# Patient Record
Sex: Female | Born: 1995 | Race: White | Hispanic: No | Marital: Married | Smoking: Never smoker
Health system: Southern US, Community
[De-identification: ages and names within clinical notes are randomized; demographics above are authoritative.]

## PROBLEM LIST (undated history)

## (undated) DIAGNOSIS — R519 Headache, unspecified: Secondary | ICD-10-CM

## (undated) DIAGNOSIS — R51 Headache: Secondary | ICD-10-CM

## (undated) DIAGNOSIS — Z8759 Personal history of other complications of pregnancy, childbirth and the puerperium: Secondary | ICD-10-CM

## (undated) DIAGNOSIS — K219 Gastro-esophageal reflux disease without esophagitis: Secondary | ICD-10-CM

## (undated) DIAGNOSIS — J45909 Unspecified asthma, uncomplicated: Secondary | ICD-10-CM

## (undated) HISTORY — PX: UPPER GI ENDOSCOPY: SHX6162

## (undated) HISTORY — DX: Headache: R51

## (undated) HISTORY — DX: Gastro-esophageal reflux disease without esophagitis: K21.9

## (undated) HISTORY — DX: Headache, unspecified: R51.9

## (undated) HISTORY — PX: WISDOM TOOTH EXTRACTION: SHX21

---
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
## 2014-03-02 ENCOUNTER — Encounter: Payer: Self-pay | Admitting: Family Medicine

## 2014-03-02 ENCOUNTER — Encounter (INDEPENDENT_AMBULATORY_CARE_PROVIDER_SITE_OTHER): Payer: Self-pay

## 2014-03-02 ENCOUNTER — Ambulatory Visit (INDEPENDENT_AMBULATORY_CARE_PROVIDER_SITE_OTHER): Payer: 59 | Admitting: Family Medicine

## 2014-03-02 VITALS — BP 102/60 | HR 59 | Temp 98.1°F | Ht 66.0 in | Wt 144.8 lb

## 2014-03-02 DIAGNOSIS — S0300XS Dislocation of jaw, unspecified side, sequela: Secondary | ICD-10-CM

## 2014-03-02 DIAGNOSIS — E162 Hypoglycemia, unspecified: Secondary | ICD-10-CM | POA: Insufficient documentation

## 2014-03-02 DIAGNOSIS — S0300XA Dislocation of jaw, unspecified side, initial encounter: Secondary | ICD-10-CM | POA: Insufficient documentation

## 2014-03-02 DIAGNOSIS — R42 Dizziness and giddiness: Secondary | ICD-10-CM

## 2014-03-02 DIAGNOSIS — K219 Gastro-esophageal reflux disease without esophagitis: Secondary | ICD-10-CM

## 2014-03-02 DIAGNOSIS — IMO0002 Reserved for concepts with insufficient information to code with codable children: Secondary | ICD-10-CM

## 2014-03-02 DIAGNOSIS — Z0000 Encounter for general adult medical examination without abnormal findings: Secondary | ICD-10-CM | POA: Insufficient documentation

## 2014-03-02 HISTORY — DX: Dislocation of jaw, unspecified side, initial encounter: S03.00XA

## 2014-03-02 LAB — CBC WITH DIFFERENTIAL/PLATELET
Basophils Absolute: 0 10*3/uL (ref 0.0–0.1)
Basophils Relative: 0.7 % (ref 0.0–3.0)
Eosinophils Absolute: 0.2 10*3/uL (ref 0.0–0.7)
Eosinophils Relative: 4 % (ref 0.0–5.0)
HEMATOCRIT: 35 % — AB (ref 36.0–49.0)
Hemoglobin: 11.6 g/dL — ABNORMAL LOW (ref 12.0–16.0)
LYMPHS ABS: 1.5 10*3/uL (ref 0.7–4.0)
Lymphocytes Relative: 31 % (ref 24.0–48.0)
MCHC: 33.1 g/dL (ref 31.0–37.0)
MCV: 81.4 fl (ref 78.0–98.0)
MONO ABS: 0.5 10*3/uL (ref 0.1–1.0)
MONOS PCT: 11 % (ref 3.0–12.0)
Neutro Abs: 2.5 10*3/uL (ref 1.4–7.7)
Neutrophils Relative %: 53.3 % (ref 43.0–71.0)
PLATELETS: 211 10*3/uL (ref 150.0–575.0)
RBC: 4.29 Mil/uL (ref 3.80–5.70)
RDW: 14.1 % (ref 11.4–15.5)
WBC: 4.7 10*3/uL (ref 4.5–13.5)

## 2014-03-02 LAB — TSH: TSH: 1.88 u[IU]/mL (ref 0.40–5.00)

## 2014-03-02 LAB — HEMOGLOBIN A1C: Hgb A1c MFr Bld: 5.3 % (ref 4.6–6.5)

## 2014-03-02 NOTE — Assessment & Plan Note (Signed)
Check a1c today

## 2014-03-02 NOTE — Assessment & Plan Note (Signed)
Labs today

## 2014-03-02 NOTE — Progress Notes (Signed)
Pre visit review using our clinic review tool, if applicable. No additional management support is needed unless otherwise documented below in the visit note. 

## 2014-03-02 NOTE — Assessment & Plan Note (Signed)
Discussed GERD diet- see a1c. Advised ok to take H2 blocker more regularly than Nexium.

## 2014-03-02 NOTE — Progress Notes (Signed)
Subjective:   Patient ID: Glenda Morrison, female    DOB: [birth year], 18 y.o.   MRN: [id number]  Glenda HaroldRebekah Morrison is a pleasant 18 y.o. year old female who presents to clinic today with Establish Care, Annual Exam and Heartburn  on 03/02/2014  HPI:  Just graduated from high school- plans on going to stay with her brother in [place] for a few months.  Brings in old records- [facility]  Was last seen on 12/01/13- persistent GERD symptoms?RUQ pain. Was also associated with nausea, hurt to touch under rib. Abdominal ultrasound from 06/16/13 and 12/03/13 unremarkable. H.Pylori indeterminate. Pain does seem worse when she has not eaten in a long time or if she eats pizza sauce.   Pain has actually been better lately.  Was taking Nexium which did help. 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  Always takes with food.  She is concerned about low blood sugar.  Last two months, feels very shaky and nauseated if it has been over an hour since she ate.  No family h/o DM.  Lab Results  Component Value Date   ALT 20 06/16/2013   AST 19 06/16/2013   Lab Results  Component Value Date   WBC 6.4 06/16/2013   HGB 12.0 06/16/2013   HCT 35* 06/16/2013   PLT 256 06/16/2013   No current outpatient prescriptions on file prior to visit.   No current facility-administered medications on file prior to visit.    No Known Allergies  Past Medical History  Diagnosis Date  . GERD (gastroesophageal reflux disease)   . Frequent headaches     Past Surgical History  Procedure Laterality Date  . Wisdom tooth extraction      Family History  Problem Relation Age of Onset  . Arthritis Father   . Stroke Father   . Cancer Maternal Uncle   . Stroke Paternal Uncle   . Arthritis Maternal Grandmother   . Cancer Maternal Grandfather   . Alcohol abuse Paternal Grandfather     History   Social History  . Marital Status:  Single    Spouse Name: N/A    Number of Children: N/A  . Years of Education: N/A   Occupational History  . Not on file.   Social History Main Topics  . Smoking status: Never Smoker   . Smokeless tobacco: Never Used  . Alcohol Use: No  . Drug Use: No  . Sexual Activity: No   Other Topics Concern  . 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    Review of Systems See HPI No changes in her bowel habits No blood in stool or black stools No CP or SOB Virginal    Objective:    BP 102/60  Pulse 59  Temp(Src) 98.1 F (36.7 C) (Oral)  Ht 5\' 6"  (1.676 m)  Wt 144 lb 12 oz (65.658 kg)  BMI 23.37 kg/m2  SpO2 99%  LMP 02/20/2014   Physical Exam   General:  Well-developed,well-nourished,in no acute distress; alert,appropriate and cooperative throughout examination Head:  normocephalic and atraumatic.   Eyes:  vision grossly intact, pupils equal, pupils round, and pupils reactive to light.   Ears:  R ear normal and L ear normal.  Nose:  no external deformity.   Mouth:  good dentition.   Neck:  No deformities, masses, or tenderness noted.   Lungs:  Normal respiratory effort, chest expands symmetrically. Lungs are clear to auscultation, no crackles or wheezes. Heart:  Normal rate and regular rhythm. S1 and S2 normal without gallop, murmur, click, rub or other extra sounds. Abdomen:  Bowel sounds positive,abdomen soft and non-tender without masses, organomegaly or hernias noted. Msk:  No deformity or scoliosis noted of thoracic or lumbar spine.   Extremities:  No clubbing, cyanosis, edema, or deformity noted with normal full range of motion of all joints.   Neurologic:  alert & oriented X3 and gait normal.   Skin:  Intact without suspicious lesions or rashes Cervical Nodes:  No  lymphadenopathy noted Axillary Nodes:  No palpable lymphadenopathy Psych:  Cognition and judgment appear intact. Alert and cooperative with normal attention span and concentration. No apparent delusions, illusions, hallucinations       Assessment & Plan:   Routine general medical examination at a health care facility  Gastroesophageal reflux disease, esophagitis presence not specified  Hypoglycemia - Plan: Hemoglobin A1c  Dizziness and giddiness - Plan: TSH, CBC with Differential  TMJ (dislocation of temporomandibular joint), sequela No Follow-up on file.

## 2014-03-02 NOTE — Assessment & Plan Note (Signed)
Reviewed preventive care protocols, scheduled due services, and updated immunizations Discussed nutrition, exercise, diet, and healthy lifestyle.  

## 2014-03-02 NOTE — Patient Instructions (Signed)
   Great to meet you. You can try over the counter Pepcid or Zantac.  Its ok to take Nexium when you have to.  Food Choices for Gastroesophageal Reflux Disease When you have gastroesophageal reflux disease (GERD), the foods you eat and your eating habits are very important. Choosing the right foods can help ease the discomfort of GERD. WHAT GENERAL GUIDELINES DO I NEED TO FOLLOW?  Choose fruits, vegetables, whole grains, low-fat dairy products, and low-fat meat, fish, and poultry.  Limit fats such as oils, salad dressings, butter, nuts, and avocado.  Keep a food diary to identify foods that cause symptoms.  Avoid foods that cause reflux. These may be different for different people.  Eat frequent small meals instead of three large meals each day.  Eat your meals slowly, in a relaxed setting.  Limit fried foods.  Cook foods using methods other than frying.  Avoid drinking alcohol.  Avoid drinking large amounts of liquids with your meals.  Avoid bending over or lying down until 2-3 hours after eating. WHAT FOODS ARE NOT RECOMMENDED? The following are some foods and drinks that may worsen your symptoms: Vegetables Tomatoes. Tomato juice. Tomato and spaghetti sauce. Chili peppers. Onion and garlic. Horseradish. Fruits Oranges, grapefruit, and lemon (fruit and juice). Meats High-fat meats, fish, and poultry. This includes hot dogs, ribs, ham, sausage, salami, and bacon. Dairy Whole milk and chocolate milk. Sour cream. Cream. Butter. Ice cream. Cream cheese.  Beverages Coffee and tea, with or without caffeine. Carbonated beverages or energy drinks. Condiments Hot sauce. Barbecue sauce.  Sweets/Desserts Chocolate and cocoa. Donuts. Peppermint and spearmint. Fats and Oils High-fat foods, including [place] fries and potato chips. Other Vinegar. Strong spices, such as black pepper, white pepper, red pepper, cayenne, curry powder, cloves, ginger, and chili powder. The items  listed above may not be a complete list of foods and beverages to avoid. Contact your dietitian for more information. Document Released: 08/13/2005 Document Revised: 08/18/2013 Document Reviewed: 06/17/2013 [facility] Patient Information 2015 [place]. This information is not intended to replace advice given to you by your health care provider. Make sure you discuss any questions you have with your health care provider.

## 2014-03-02 NOTE — Assessment & Plan Note (Signed)
Symptoms quiet currently. Advised taking Naproxen sparingly.

## 2014-08-17 ENCOUNTER — Encounter: Payer: Self-pay | Admitting: Family Medicine

## 2014-08-17 ENCOUNTER — Ambulatory Visit (INDEPENDENT_AMBULATORY_CARE_PROVIDER_SITE_OTHER): Payer: 59 | Admitting: Family Medicine

## 2014-08-17 VITALS — BP 114/70 | HR 61 | Temp 98.0°F | Wt 151.2 lb

## 2014-08-17 DIAGNOSIS — S90212A Contusion of left great toe with damage to nail, initial encounter: Secondary | ICD-10-CM

## 2014-08-17 NOTE — Progress Notes (Signed)
Pre visit review using our clinic review tool, if applicable. No additional management support is needed unless otherwise documented below in the visit note. 

## 2014-08-17 NOTE — Assessment & Plan Note (Signed)
New with recent xray neg for fracture at [facility]. Likely too many days since injury to perform trephination at base of nail although likely would have provided relief if it was done initially after injury. No indication of infection at this point. Advised supportive care with ice, rest- see AVS. Call or return to clinic prn if these symptoms worsen or fail to improve as anticipated. The patient indicates understanding of these issues and agrees with the plan.

## 2014-08-17 NOTE — Patient Instructions (Signed)
Subungual Hematoma °A subungual hematoma is a pocket of blood that collects under the fingernail or toenail. The pressure created by the blood under the nail can cause pain. °CAUSES  °A subungual hematoma occurs when an injury to the finger or toe causes a blood vessel beneath the nail to break. The injury can occur from a direct blow such as slamming a finger in a door. It can also occur from a repeated injury such as pressure on the foot in a shoe while running. A subungual hematoma is sometimes called runner's toe or tennis toe. °SYMPTOMS  °· Blue or dark blue skin under the nail. °· Pain or throbbing in the injured area. °DIAGNOSIS  °Your caregiver can determine whether you have a subungual hematoma based on your history and a physical exam. If your caregiver thinks you might have a broken (fractured) bone, X-rays may be taken. °TREATMENT  °Hematomas usually go away on their own over time. Your caregiver may make a hole in the nail to drain the blood. Draining the blood is painless and usually provides significant relief from pain and throbbing. The nail usually grows back normally after this procedure. In some cases, the nail may need to be removed. This is done if there is a cut under the nail that requires stitches (sutures). °HOME CARE INSTRUCTIONS  °· Put ice on the injured area. °¨ Put ice in a plastic bag. °¨ Place a towel between your skin and the bag. °¨ Leave the ice on for 15-20 minutes, 03-04 times a day for the first 1 to 2 days. °· Elevate the injured area to help decrease pain and swelling. °· If you were given a bandage, wear it for as long as directed by your caregiver. °· If part of your nail falls off, trim the remaining nail gently. This prevents the nail from catching on something and causing further injury. °· Only take over-the-counter or prescription medicines for pain, discomfort, or fever as directed by your caregiver. °SEEK IMMEDIATE MEDICAL CARE IF:  °· You have redness or swelling  around the nail. °· You have yellowish-white fluid (pus) coming from the nail. °· Your pain is not controlled with medicine. °· You have a fever. °MAKE SURE YOU: °· Understand these instructions. °· Will watch your condition. °· Will get help right away if you are not doing well or get worse. °Document Released: 08/10/2000 Document Revised: 11/05/2011 Document Reviewed: 08/01/2011 °ExitCare® Patient Information ©2015 [employer] LLC. This information is not intended to replace advice given to you by your health care provider. Make sure you discuss any questions you have with your health care provider. ° °

## 2014-08-17 NOTE — Progress Notes (Signed)
Patient ID: Glenda Morrison, female   DOB: 06-06-1996, 18 y.o.   MRN: [id number]   Subjective:   Patient ID: Glenda Morrison, female    DOB: 06-06-1996, 18 y.o.   MRN: [id number]  Glenda Morrison is a pleasant 18 y.o. year old female who presents to clinic today with her mom for Toe Pain  on 08/17/2014  HPI: 5 days ago, at work, dropped a door on base of nail of great left toe. Immediately started to swell and become painful.  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  Later that night, toenail turned black and started throbbing.  [facility]  Per pt's mom, xray was neg.  Was told it would just go away.  Since the, throbbing persists.  No fevers, no pus draining.  Current Outpatient Prescriptions on File Prior to Visit  Medication Sig Dispense Refill  . Esomeprazole Magnesium (NEXIUM 24HR PO) Take 1 tablet by mouth.    . naproxen (NAPROSYN) 500 MG tablet Take 500 mg by mouth 2 (two) times daily with a meal.    . Olopatadine HCl (PATADAY) 0.2 % SOLN Apply to eye.    . Triamcinolone Acetonide (NASACORT ALLERGY 24HR NA) Place into the nose.     No current facility-administered medications on file prior to visit.    No Known Allergies  Past Medical History  Diagnosis Date  . GERD (gastroesophageal reflux disease)   . Frequent headaches     Past Surgical History  Procedure Laterality Date  . Wisdom tooth extraction      Family History  Problem Relation Age of Onset  . Arthritis Father   . Stroke Father   . Cancer Maternal Uncle   . Stroke Paternal Uncle   . Arthritis Maternal Grandmother   . Cancer Maternal Grandfather   . Alcohol abuse Paternal Grandfather     History   Social History  . Marital Status: Single    Spouse Name: N/A    Number of Children: N/A  . Years of Education: N/A   Occupational History  . Not on file.   Social History Main Topics  . Smoking status: Never Smoker   . Smokeless tobacco: Never Used  . Alcohol Use: No  . Drug Use: No    . Sexual Activity: No   Other Topics Concern  . 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   Review of Systems  Constitutional: Negative.   Gastrointestinal: Negative.   Endocrine: Negative.   Musculoskeletal: Negative.   Skin: Positive for wound.  Hematological: Negative.   Psychiatric/Behavioral: Negative.        Objective:    BP 114/70 mmHg  Pulse 61  Temp(Src) 98 F (36.7 C) (Oral)  Wt 151 lb 4 oz (68.607 kg)  SpO2 99%  LMP 08/14/2014   Physical Exam  Constitutional: She is oriented to person, place, and time. She appears well-developed and well-nourished. No distress.  HENT:  Head: Normocephalic.  Eyes: Conjunctivae are normal.  Neck: Normal range of motion.  Cardiovascular: Normal rate.   Pulmonary/Chest: Effort normal.  Musculoskeletal:       Feet:  Neurological: She is alert and oriented to person, place, and time.  Skin: Skin is warm and dry.  Psychiatric: She has a normal mood and affect. Her behavior is  normal. Judgment and thought content normal.  Nursing note and vitals reviewed.         Assessment & Plan:   Subungual hematoma of great toe of left foot, initial encounter No Follow-up on file.

## 2015-01-12 ENCOUNTER — Ambulatory Visit: Payer: Self-pay | Admitting: Family Medicine

## 2015-01-27 ENCOUNTER — Ambulatory Visit: Payer: 59 | Admitting: Family Medicine

## 2015-01-28 ENCOUNTER — Encounter: Payer: Self-pay | Admitting: Primary Care

## 2015-01-28 ENCOUNTER — Ambulatory Visit (INDEPENDENT_AMBULATORY_CARE_PROVIDER_SITE_OTHER): Payer: 59 | Admitting: Primary Care

## 2015-01-28 VITALS — BP 122/72 | HR 81 | Temp 98.6°F | Ht 66.0 in | Wt 150.8 lb

## 2015-01-28 DIAGNOSIS — R519 Headache, unspecified: Secondary | ICD-10-CM

## 2015-01-28 DIAGNOSIS — R51 Headache: Secondary | ICD-10-CM | POA: Diagnosis not present

## 2015-01-28 DIAGNOSIS — K219 Gastro-esophageal reflux disease without esophagitis: Secondary | ICD-10-CM

## 2015-01-28 MED ORDER — PANTOPRAZOLE SODIUM 20 MG PO TBEC: 20.0000 mg | DELAYED_RELEASE_TABLET | Freq: Every day | ORAL | Status: DC

## 2015-01-28 NOTE — Assessment & Plan Note (Signed)
Symptoms continue despite GERD diet, nexium, tums, and natural remedies. Started Pantoprazole 20 mg tablets today. Handout provided regarding trigger foods. Follow up with PCP in 4 weeks for re-evaluation.

## 2015-01-28 NOTE — Patient Instructions (Addendum)
Start Pantoprazole for acid reflux. Take 1 tablet by mouth daily. Use ibuprofen sparingly due to irritation of reflux symptoms. Start tylenol or excedrin migraine for headaches. Follow up with PCP in 4 weeks for re-evaluation of reflux and headaches.  It was nice meeting you!  Food Choices for Gastroesophageal Reflux Disease When you have gastroesophageal reflux disease (GERD), the foods you eat and your eating habits are very important. Choosing the right foods can help ease the discomfort of GERD. WHAT GENERAL GUIDELINES DO I NEED TO FOLLOW?  Choose fruits, vegetables, whole grains, low-fat dairy products, and low-fat meat, fish, and poultry.  Limit fats such as oils, salad dressings, butter, nuts, and avocado.  Keep a food diary to identify foods that cause symptoms.  Avoid foods that cause reflux. These may be different for different people.  Eat frequent small meals instead of three large meals each day.  Eat your meals slowly, in a relaxed setting.  Limit fried foods.  Cook foods using methods other than frying.  Avoid drinking alcohol.  Avoid drinking large amounts of liquids with your meals.  Avoid bending over or lying down until 2-3 hours after eating. WHAT FOODS ARE NOT RECOMMENDED? The following are some foods and drinks that may worsen your symptoms: Vegetables Tomatoes. Tomato juice. Tomato and spaghetti sauce. Chili peppers. Onion and garlic. Horseradish. Fruits Oranges, grapefruit, and lemon (fruit and juice). Meats High-fat meats, fish, and poultry. This includes hot dogs, ribs, ham, sausage, salami, and bacon. Dairy Whole milk and chocolate milk. Sour cream. Cream. Butter. Ice cream. Cream cheese.  Beverages Coffee and tea, with or without caffeine. Carbonated beverages or energy drinks. Condiments Hot sauce. Barbecue sauce.  Sweets/Desserts Chocolate and cocoa. Donuts. Peppermint and spearmint. Fats and Oils High-fat foods, including [place] fries and  potato chips. Other Vinegar. Strong spices, such as black pepper, white pepper, red pepper, cayenne, curry powder, cloves, ginger, and chili powder. The items listed above may not be a complete list of foods and beverages to avoid. Contact your dietitian for more information. Document Released: 08/13/2005 Document Revised: 08/18/2013 Document Reviewed: 06/17/2013 [facility] Patient Information 2015 [place], [place]. This information is not intended to replace advice given to you by your health care provider. Make sure you discuss any questions you have with your health care provider.

## 2015-01-28 NOTE — Progress Notes (Signed)
Pre visit review using our clinic review tool, if applicable. No additional management support is needed unless otherwise documented below in the visit note. 

## 2015-01-28 NOTE — Assessment & Plan Note (Signed)
Multiple times weekly for several years with photophobia and nausea. Takes iburpofen with temporary relief. May be a good candidate for preventative therapy, but did not want to introduce 2 medications simultaneously. Consider during next visit.
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
## 2015-04-04 ENCOUNTER — Ambulatory Visit (INDEPENDENT_AMBULATORY_CARE_PROVIDER_SITE_OTHER): Payer: 59 | Admitting: Family Medicine

## 2015-04-04 ENCOUNTER — Encounter: Payer: Self-pay | Admitting: Family Medicine

## 2015-04-04 VITALS — BP 112/60 | HR 60 | Temp 97.3°F | Ht 66.5 in | Wt 147.2 lb

## 2015-04-04 DIAGNOSIS — Z0000 Encounter for general adult medical examination without abnormal findings: Secondary | ICD-10-CM | POA: Diagnosis not present

## 2015-04-04 DIAGNOSIS — R238 Other skin changes: Secondary | ICD-10-CM

## 2015-04-04 DIAGNOSIS — R233 Spontaneous ecchymoses: Secondary | ICD-10-CM | POA: Insufficient documentation

## 2015-04-04 LAB — LIPID PANEL
CHOLESTEROL: 156 mg/dL (ref 0–200)
HDL: 57.4 mg/dL (ref >39.00)
LDL Cholesterol: 84 mg/dL (ref 0–99)
NONHDL: 98.32
Total CHOL/HDL Ratio: 3
Triglycerides: 72 mg/dL (ref 0.0–149.0)
VLDL: 14.4 mg/dL (ref 0.0–40.0)

## 2015-04-04 LAB — CBC WITH DIFFERENTIAL/PLATELET
BASOS PCT: 0.5 % (ref 0.0–3.0)
Basophils Absolute: 0 10*3/uL (ref 0.0–0.1)
EOS ABS: 0.2 10*3/uL (ref 0.0–0.7)
Eosinophils Relative: 3.9 % (ref 0.0–5.0)
HCT: 36.8 % (ref 36.0–49.0)
Hemoglobin: 11.9 g/dL — ABNORMAL LOW (ref 12.0–16.0)
LYMPHS PCT: 22.7 % — AB (ref 24.0–48.0)
Lymphs Abs: 1.4 10*3/uL (ref 0.7–4.0)
MCHC: 32.3 g/dL (ref 31.0–37.0)
MCV: 76.3 fl — ABNORMAL LOW (ref 78.0–98.0)
MONO ABS: 0.6 10*3/uL (ref 0.1–1.0)
Monocytes Relative: 10.8 % (ref 3.0–12.0)
NEUTROS ABS: 3.7 10*3/uL (ref 1.4–7.7)
NEUTROS PCT: 62.1 % (ref 43.0–71.0)
PLATELETS: 240 10*3/uL (ref 150.0–575.0)
RBC: 4.82 Mil/uL (ref 3.80–5.70)
RDW: 14.8 % (ref 11.4–15.5)
WBC: 6 10*3/uL (ref 4.5–13.5)

## 2015-04-04 LAB — COMPREHENSIVE METABOLIC PANEL
ALT: 14 U/L (ref 0–35)
AST: 20 U/L (ref 0–37)
Albumin: 4.5 g/dL (ref 3.5–5.2)
Alkaline Phosphatase: 68 U/L (ref 47–119)
BUN: 9 mg/dL (ref 6–23)
CALCIUM: 9.7 mg/dL (ref 8.4–10.5)
CO2: 26 mEq/L (ref 19–32)
CREATININE: 0.73 mg/dL (ref 0.40–1.20)
Chloride: 104 mEq/L (ref 96–112)
GFR: 108.12 mL/min (ref >60.00)
Glucose, Bld: 76 mg/dL (ref 70–99)
POTASSIUM: 4 meq/L (ref 3.5–5.1)
SODIUM: 137 meq/L (ref 135–145)
Total Bilirubin: 0.5 mg/dL (ref 0.2–1.2)
Total Protein: 7.6 g/dL (ref 6.0–8.3)

## 2015-04-04 LAB — PROTIME-INR
INR: 1.1 ratio — ABNORMAL HIGH (ref 0.8–1.0)
PROTHROMBIN TIME: 12.5 s (ref 9.6–13.1)

## 2015-04-04 LAB — TSH: TSH: 1.58 u[IU]/mL (ref 0.40–5.00)

## 2015-04-04 NOTE — Patient Instructions (Signed)
Great to see you.  We will call you with your lab results. 

## 2015-04-04 NOTE — Assessment & Plan Note (Signed)
Reviewed preventive care protocols, scheduled due services, and updated immunizations Discussed nutrition, exercise, diet, and healthy lifestyle.  

## 2015-04-04 NOTE — Progress Notes (Signed)
Pre visit review using our clinic review tool, if applicable. No additional management support is needed unless otherwise documented below in the visit note. 

## 2015-04-04 NOTE — Progress Notes (Signed)
Subjective:   Patient ID: Glenda Morrison, female    DOB: Aug 15, 1996, 19 y.o.   MRN: [id number]  Signa Cheek is a pleasant 19 y.o. year old female who presents to clinic today with Annual Exam; Heartburn; Back Pain; and Headache  on 04/04/2015  HPI:  Doing ok.  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  Still having intermittent GERD flares but improved from last year.  Easy bruising- her mom is concerned that she bruises so easily.  Denies blood in her stool or urine.  No nose bleeds.    Lab Results  Component Value Date   ALT 24 12/03/2013   AST 17 12/03/2013   ALKPHOS 88 12/03/2013   BILITOT 0.3 12/03/2013   Lab Results  Component Value Date   WBC 4.7 03/02/2014   HGB 11.6* 03/02/2014   HCT 35.0* 03/02/2014   MCV 81.4 03/02/2014   PLT 211.0 03/02/2014   Lab Results  Component Value Date   TSH 1.88 03/02/2014   Lab Results  Component Value Date   NA 137 12/03/2013   K 3.9 12/03/2013   CL 106 12/03/2013   CO2 28* 12/03/2013   Lab Results  Component Value Date   CREATININE 0.77 12/03/2013   No results found for: CHOL, HDL, LDLCALC, LDLDIRECT, TRIG, CHOLHDL  No current outpatient prescriptions on file prior to visit.   No current facility-administered medications on file prior to visit.    No Known Allergies  Past Medical History  Diagnosis Date  . GERD (gastroesophageal reflux disease)   . Frequent headaches     Past Surgical History  Procedure Laterality Date  . Wisdom tooth extraction      Family History  Problem Relation Age of Onset  . Arthritis Father   . Stroke Father   . Cancer Maternal Uncle   . Stroke Paternal Uncle   . Arthritis Maternal Grandmother   . Cancer Maternal Grandfather   . Alcohol abuse Paternal Grandfather     History   Social History  . Marital Status: Single    Spouse Name: N/A  . Number of Children: N/A  . Years of Education: N/A   Occupational History  . Not on file.   Social History  Main Topics  . Smoking status: Never Smoker   . Smokeless tobacco: Never Used  . Alcohol Use: No  . Drug Use: No  . Sexual Activity: No   Other Topics Concern  . 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    Review of Systems  Constitutional: Negative.   HENT: Negative.   Cardiovascular: Negative.   Gastrointestinal: Negative.   Endocrine: Negative.   Genitourinary: Negative.   Musculoskeletal: Negative.   Skin: Negative.   Allergic/Immunologic: Negative.   Neurological: Negative.   Hematological: Bruises/bleeds easily.  All other systems reviewed and are negative.   Objective:    BP 112/60 mmHg  Pulse 60  Temp(Src) 97.3 F (36.3 C) (Oral)  Ht 5' 6.5" (1.689 m)  Wt 147 lb 4 oz (66.792 kg)  BMI 23.41 kg/m2  SpO2 98%  LMP 03/06/2015   Physical Exam   General:  Well-developed,well-nourished,in no acute distress; alert,appropriate and cooperative throughout examination Head:  normocephalic and atraumatic.   Eyes:  vision grossly intact, pupils equal, pupils round, and pupils reactive  to light.   Ears:  R ear normal and L ear normal.   Nose:  no external deformity.   Mouth:  good dentition.   Neck:  No deformities, masses, or tenderness noted.   Lungs:  Normal respiratory effort, chest expands symmetrically. Lungs are clear to auscultation, no crackles or wheezes. Heart:  Normal rate and regular rhythm. S1 and S2 normal without gallop, murmur, click, rub or other extra sounds. Abdomen:  Bowel sounds positive,abdomen soft and non-tender without masses, organomegaly or hernias noted. Msk:  No deformity or scoliosis noted of thoracic or lumbar spine.   Extremities:  No clubbing, cyanosis, edema, or deformity noted with normal full range of motion of all joints.   Neurologic:   alert & oriented X3 and gait normal.   Skin:  Intact without suspicious lesions or rashes Cervical Nodes:  No lymphadenopathy noted Axillary Nodes:  No palpable lymphadenopathy Psych:  Cognition and judgment appear intact. Alert and cooperative with normal attention span and concentration. No apparent delusions, illusions, hallucinations       Assessment & Plan:   Routine general medical examination at a health care facility No Follow-up on file.

## 2015-04-04 NOTE — Assessment & Plan Note (Signed)
New- advised NSAIDs with caution as they can also worsen GERD. Check labs today. Orders Placed This Encounter  Procedures  . CBC with Differential/Platelet  . Protime-INR  . Comprehensive metabolic panel  . Lipid panel  . TSH

## 2015-04-05 ENCOUNTER — Encounter: Payer: Self-pay | Admitting: *Deleted

## 2015-07-28 ENCOUNTER — Ambulatory Visit: Payer: 59 | Admitting: Primary Care

## 2015-08-08 ENCOUNTER — Encounter: Payer: Self-pay | Admitting: Internal Medicine

## 2015-08-08 ENCOUNTER — Encounter: Payer: Self-pay | Admitting: Family Medicine

## 2015-08-08 ENCOUNTER — Ambulatory Visit (INDEPENDENT_AMBULATORY_CARE_PROVIDER_SITE_OTHER): Payer: 59 | Admitting: Family Medicine

## 2015-08-08 VITALS — BP 104/70 | HR 59 | Temp 97.7°F | Wt 149.0 lb

## 2015-08-08 DIAGNOSIS — K219 Gastro-esophageal reflux disease without esophagitis: Secondary | ICD-10-CM | POA: Diagnosis not present

## 2015-08-08 NOTE — Patient Instructions (Signed)
Great to see you. Please stop by to see Marion on your way out.   

## 2015-08-08 NOTE — Progress Notes (Signed)
Pre visit review using our clinic review tool, if applicable. No additional management support is needed unless otherwise documented below in the visit note. 

## 2015-08-08 NOTE — Assessment & Plan Note (Signed)
Deteriorated. Not currently taking PPI or H2 blockers- feels they made her feel worse. No red flag symptoms. Refer to GI for endoscopy The patient indicates understanding of these issues and agrees with the plan.

## 2015-08-08 NOTE — Progress Notes (Signed)
Subjective:   Patient ID: Glenda Morrison, female    DOB: [birth year], 19 y.o.   MRN: [id number]  Glenda HaroldRebekah Morrison is a pleasant 19 y.o. year old female who presents to clinic today with Follow-up and abnormal period  on 08/08/2015  HPI:  GERD- Persistent GERD symptoms for years, since prior to establishing care with me.  Abdominal ultrasound from 06/16/13 and 12/03/13 unremarkable. H.Pylori indeterminate. Pain does seem worse when she has not eaten in a long time or if she eats pizza sauce.     Was taking Nexium which did help, now she feels this makes it worse. 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  Always takes with food.    Lab Results  Component Value Date   ALT 14 04/04/2015   AST 20 04/04/2015   ALKPHOS 68 04/04/2015   BILITOT 0.5 04/04/2015   Lab Results  Component Value Date   WBC 6.0 04/04/2015   HGB 11.9* 04/04/2015   HCT 36.8 04/04/2015   MCV 76.3* 04/04/2015   PLT 240.0 04/04/2015   No current outpatient prescriptions on file prior to visit.   No current facility-administered medications on file prior to visit.    No Known Allergies  Past Medical History  Diagnosis Date  . GERD (gastroesophageal reflux disease)   . Frequent headaches     Past Surgical History  Procedure Laterality Date  . Wisdom tooth extraction      Family History  Problem Relation Age of Onset  . Arthritis Father   . Stroke Father   . Cancer Maternal Uncle   . Stroke Paternal Uncle   . Arthritis Maternal Grandmother   . Cancer Maternal Grandfather   . Alcohol abuse Paternal Grandfather     Social History   Social History  . Marital Status: Single    Spouse Name: N/A  . Number of Children: N/A  . Years of Education: N/A   Occupational History  . Not on file.   Social History Main Topics  . Smoking status: Never Smoker   . Smokeless tobacco: Never Used  . Alcohol Use: No  . Drug Use: No  . Sexual Activity: No    Other Topics Concern  . 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    Review of Systems  Constitutional: Negative.   HENT: Negative.   Gastrointestinal: Positive for abdominal pain. Negative for nausea, vomiting, diarrhea, constipation, blood in stool, abdominal distention, anal bleeding and rectal pain.  Endocrine: Negative.   Musculoskeletal: Negative.   Skin: Negative.   Neurological: Negative.   Hematological: Negative.         Objective:    BP 104/70 mmHg  Pulse 59  Temp(Src) 97.7 F (36.5 C) (Oral)  Wt 149 lb (67.586 kg)  SpO2 99%  LMP 07/09/2015   Physical Exam   General:  Well-developed,well-nourished,in no acute distress; alert,appropriate and cooperative throughout examination Head:  normocephalic and atraumatic.    Mouth:  good dentition.   Neck:  No deformities, masses, or tenderness noted.   Lungs:  Normal respiratory effort, chest expands symmetrically. Lungs are clear to auscultation, no crackles or  wheezes. Heart:  Normal rate and regular rhythm. S1 and S2 normal without gallop, murmur, click, rub or other extra sounds. Abdomen:  Bowel sounds positive,abdomen soft and non-tender without masses, organomegaly or hernias noted. Msk:  No deformity or scoliosis noted of thoracic or lumbar spine.   Extremities:  No clubbing, cyanosis, edema, or deformity noted with normal full range of motion of all joints.   Neurologic:  alert & oriented X3 and gait normal.   Skin:  Intact without suspicious lesions or rashes Psych:  Cognition and judgment appear intact. Alert and cooperative with normal attention span and concentration. No apparent delusions, illusions,  hallucinations       Assessment & Plan:   Gastroesophageal reflux disease, esophagitis presence not specified - Plan: Ambulatory referral to Gastroenterology No Follow-up on file.

## 2015-08-12 ENCOUNTER — Telehealth: Payer: Self-pay

## 2015-08-12 MED ORDER — NAPROXEN 500 MG PO TABS: 500.0000 mg | ORAL_TABLET | Freq: Two times a day (BID) | ORAL | Status: DC

## 2015-08-12 NOTE — Telephone Encounter (Signed)
eRx sent

## 2015-08-12 NOTE — Telephone Encounter (Signed)
pts mom left note (DPR signed) requesting rx previously written by Dr Carlynn PurlSowles for Naproxen 500 mg to [facility] [address]. Please advise. Pt last seen 08/08/15.

## 2015-10-10 ENCOUNTER — Encounter: Payer: Self-pay | Admitting: Internal Medicine

## 2015-10-10 ENCOUNTER — Ambulatory Visit (INDEPENDENT_AMBULATORY_CARE_PROVIDER_SITE_OTHER): Payer: 59 | Admitting: Internal Medicine

## 2015-10-10 VITALS — BP 100/68 | HR 60 | Ht 67.0 in | Wt 149.2 lb

## 2015-10-10 DIAGNOSIS — K219 Gastro-esophageal reflux disease without esophagitis: Secondary | ICD-10-CM

## 2015-10-10 NOTE — Progress Notes (Signed)
HISTORY OF PRESENT ILLNESS:  Glenda Morrison is a 20 y.o. female with no significant past medical history who is referred today by her primary care physician Dr. Clifton Custard regarding a chief complaint of chronic heartburn. The patient tells me that she has had problems with nearly daily heartburn since age 59. She describes substernal burning and water brash. Occasional dysphagia and nausea. She tells me that she has been on multiple over-the-counter medications as well as prescription PPI such as Nexium. She states that these either do not work or exacerbate symptoms. She does take significant NSAIDs for headaches. She does note that her symptoms are worse with stress. She also has nocturnal symptoms at times. There has been no change in weight or other GI complaints. Her listed medications are Naprosyn and iron. She has not had prior GI evaluation. Review of outside blood work from August 2016 was negative except for mild microcytic anemia. Previous abdominal ultrasound for right sided abdominal pain April 2015 was unremarkable.  REVIEW OF SYSTEMS:  All non-GI ROS negative except for sinus allergy, anxiety, arthritis, back pain, headaches, menstrual cramps, muscle cramps, sleeping problems, sore throat  Past Medical History  Diagnosis Date  . GERD (gastroesophageal reflux disease)   . Frequent headaches     Past Surgical History  Procedure Laterality Date  . Wisdom tooth extraction      Social History Glenda Morrison  reports that she has never smoked. She has never used smokeless tobacco. She reports that she does not drink alcohol or use illicit drugs.  family history includes Alcohol abuse in her paternal grandfather; Arthritis in her father and maternal grandmother; Esophageal cancer in her maternal grandmother; Pancreatic cancer in her maternal grandfather and maternal uncle; Stroke in her father and paternal uncle.  No Known Allergies     PHYSICAL EXAMINATION: Vital signs: BP 100/68  mmHg  Pulse 60  Ht  (1.702 m)  Wt 149 lb 4 oz (67.699 kg)  BMI 23.37 kg/m2  LMP 09/16/2015 (Approximate)  Constitutional: generally well-appearing, no acute distress Psychiatric: alert and oriented x3, cooperative Eyes: extraocular movements intact, anicteric, conjunctiva pink Mouth: oral pharynx moist, no lesions. No thrush Neck: supple thyromegaly Lymph: no lymphadenopathy Cardiovascular: heart regular rate and rhythm, no murmur Lungs: clear to auscultation bilaterally Abdomen: soft, nontender, nondistended, no obvious ascites, no peritoneal signs, normal bowel sounds, no organomegaly Extremities: no lower extremity edema bilaterally Skin: no lesions on visible extremities Neuro: No focal deficits. Normal DTRs.   ASSESSMENT:  #1. GERD. Typical sounding symptoms by description. Atypical in that she does not respond to which should be adequate medication   PLAN:  #1. Reflux precautions #2. Literature on GERD provided #3. Schedule diagnostic upper endoscopy.The nature of the procedure, as well as the risks, benefits, and alternatives were carefully and thoroughly reviewed with the patient. Ample time for discussion and questions allowed. The patient understood, was satisfied, and agreed to proceed. #4. Consider trial of dexilant 60 mg daily  A copy of this dictation has been sent to Dr. Clifton Custard

## 2015-10-10 NOTE — Patient Instructions (Signed)

## 2015-10-12 ENCOUNTER — Encounter: Payer: Self-pay | Admitting: Internal Medicine

## 2015-10-12 ENCOUNTER — Ambulatory Visit (AMBULATORY_SURGERY_CENTER): Payer: 59 | Admitting: Internal Medicine

## 2015-10-12 VITALS — BP 121/69 | HR 60 | Temp 97.2°F | Resp 26 | Ht 67.0 in | Wt 149.0 lb

## 2015-10-12 DIAGNOSIS — K219 Gastro-esophageal reflux disease without esophagitis: Secondary | ICD-10-CM | POA: Diagnosis not present

## 2015-10-12 MED ORDER — DEXLANSOPRAZOLE 60 MG PO CPDR: 60.0000 mg | DELAYED_RELEASE_CAPSULE | Freq: Every day | ORAL | Status: DC

## 2015-10-12 MED ORDER — SODIUM CHLORIDE 0.9 % IV SOLN: 500.0000 mL | INTRAVENOUS | Status: DC

## 2015-10-12 NOTE — Progress Notes (Signed)
To Pacu  Pt awake and alert, report to RN 

## 2015-10-12 NOTE — Patient Instructions (Signed)
Discharge instructions given. Normal exam. Prescription sent in to pharmacy. Resume previous medications. YOU HAD AN ENDOSCOPIC PROCEDURE TODAY AT THE [facility]:   Refer to the procedure report that was given to you for any specific questions about what was found during the examination.  If the procedure report does not answer your questions, please call your gastroenterologist to clarify.  If you requested that your care partner not be given the details of your procedure findings, then the procedure report has been included in a sealed envelope for you to review at your convenience later.  YOU SHOULD EXPECT: Some feelings of bloating in the abdomen. Passage of more gas than usual.  Walking can help get rid of the air that was put into your GI tract during the procedure and reduce the bloating. If you had a lower endoscopy (such as a colonoscopy or flexible sigmoidoscopy) you may notice spotting of blood in your stool or on the toilet paper. If you underwent a bowel prep for your procedure, you may not have a normal bowel movement for a few days.  Please Note:  You might notice some irritation and congestion in your nose or some drainage.  This is from the oxygen used during your procedure.  There is no need for concern and it should clear up in a day or so.  SYMPTOMS TO REPORT IMMEDIATELY:    Following upper endoscopy (EGD)  Vomiting of blood or coffee ground material  New chest pain or pain under the shoulder blades  Painful or persistently difficult swallowing  New shortness of breath  Fever of 100F or higher  Black, tarry-looking stools  For urgent or emergent issues, a gastroenterologist can be reached at any hour by calling [phone].   DIET: Your first meal following the procedure should be a small meal and then it is ok to progress to your normal diet. Heavy or fried foods are harder to digest and may make you feel nauseous or bloated.  Likewise, meals heavy in  dairy and vegetables can increase bloating.  Drink plenty of fluids but you should avoid alcoholic beverages for 24 hours.  ACTIVITY:  You should plan to take it easy for the rest of today and you should NOT DRIVE or use heavy machinery until tomorrow (because of the sedation medicines used during the test).    FOLLOW UP: Our staff will call the number listed on your records the next business day following your procedure to check on you and address any questions or concerns that you may have regarding the information given to you following your procedure. If we do not reach you, we will leave a message.  However, if you are feeling well and you are not experiencing any problems, there is no need to return our call.  We will assume that you have returned to your regular daily activities without incident.  If any biopsies were taken you will be contacted by phone or by letter within the next 1-3 weeks.  Please call us at [phone] if you have not heard about the biopsies in 3 weeks.    SIGNATURES/CONFIDENTIALITY: You and/or your care partner have signed paperwork which will be entered into your electronic medical record.  These signatures attest to the fact that that the information above on your After Visit Summary has been reviewed and is understood.  Full responsibility of the confidentiality of this discharge information lies with you and/or your care-partner.

## 2015-10-12 NOTE — Op Note (Signed)
[facility] [address] [place], [address]   ENDOSCOPY PROCEDURE REPORT  PATIENT: Glenda Morrison, Glenda Morrison  MR#: [id number] BIRTHDATE: [birth year] , 19  yrs. old GENDER: female ENDOSCOPIST: Roxy Cedar, MD REFERRED BY:  Ruthe Mannan, M.D. PROCEDURE DATE:  10/12/2015 PROCEDURE:  EGD, diagnostic ASA CLASS:     Class I INDICATIONS:  history of esophageal reflux.  nonresponding to therapy MEDICATIONS: Monitored anesthesia care and Propofol 200 mg IV TOPICAL ANESTHETIC: none  DESCRIPTION OF PROCEDURE: After the risks benefits and alternatives of the procedure were thoroughly explained, informed consent was obtained.  The LB [id number] endoscope was introduced through the mouth and advanced to the second portion of the duodenum , Without limitations.  The instrument was slowly withdrawn as the mucosa was fully examined.    EXAM: The esophagus and gastroesophageal junction were completely normal in appearance.  The stomach was entered and closely examined.The antrum, angularis, and lesser curvature were well visualized, including a retroflexed view of the cardia and fundus. The stomach wall was normally distensable.  The scope passed easily through the pylorus into the duodenum.  Retroflexed views revealed no abnormalities.     The scope was then withdrawn from the patient and the procedure completed.  COMPLICATIONS: There were no immediate complications.  ENDOSCOPIC IMPRESSION: 1.Normal EDG 2. GERD  RECOMMENDATIONS: 1.  Anti-reflux regimen to be followed 2.  Prescribe Dexilant  daily; #30; one by mouth daily; 11 refills 3. Make an office follow up appointment with Dr. Marina Goodell in 4-6 weeks   REPEAT EXAM:  eSigned:  Roxy Cedar, MD 10/12/2015 8:02 AM    CC:The Patient and Ruthe Mannan MD

## 2015-10-13 ENCOUNTER — Telehealth: Payer: Self-pay

## 2015-10-13 NOTE — Telephone Encounter (Signed)
  Follow up Call-  Call back number 10/12/2015  Post procedure Call Back phone  # [phone]  Permission to leave phone message Yes     Patient questions:  Do you have a fever, pain , or abdominal swelling? No. Pain Score  0 *  Have you tolerated food without any problems? Yes.    Have you been able to return to your normal activities? Yes.    Do you have any questions about your discharge instructions: Diet   No. Medications  No. Follow up visit  No.  Do you have questions or concerns about your Care? No.  Actions: * If pain score is 4 or above: No action needed, pain <4.  Per pt she reported having a headache yesterday, but she said she felt better today. maw

## 2015-11-21 ENCOUNTER — Ambulatory Visit (INDEPENDENT_AMBULATORY_CARE_PROVIDER_SITE_OTHER): Payer: 59 | Admitting: Internal Medicine

## 2015-11-21 ENCOUNTER — Encounter: Payer: Self-pay | Admitting: Internal Medicine

## 2015-11-21 VITALS — BP 96/60 | HR 60 | Ht 67.0 in | Wt 149.0 lb

## 2015-11-21 DIAGNOSIS — R1013 Epigastric pain: Secondary | ICD-10-CM

## 2015-11-21 NOTE — Patient Instructions (Signed)
Please follow up as needed 

## 2015-11-21 NOTE — Progress Notes (Signed)
HISTORY OF PRESENT ILLNESS:  Glenda Morrison is a 20 y.o. female who was initially evaluated 10/10/2015 for possible GERD. See that dictation. She subsequently underwent upper endoscopy February 15th 2017. This was normal. She was prescribed Dexilant 60 mg daily. She only took this for 4 days reporting problems with dizziness and headache. However, she can have these problems intermittently chronically and has had them since discontinuing the medication. While she was on medication for 4 days she still reported "reflux" symptoms. She has noticed that Alka-Seltzer helped. Also carbonated beverages followed by belching. She continues to report increased symptoms with stress. She mentions that her father has similar symptoms with stress. No new GI complaints.  REVIEW OF SYSTEMS:  All non-GI ROS negative except for sinus allergy, anxiety, arthritis, back pain, muscle cramps, headaches, insomnia  Past Medical History  Diagnosis Date  . GERD (gastroesophageal reflux disease)   . Frequent headaches     Past Surgical History  Procedure Laterality Date  . Wisdom tooth extraction      Social History Glenda Morrison  reports that she has never smoked. She has never used smokeless tobacco. She reports that she does not drink alcohol or use illicit drugs.  family history includes Alcohol abuse in her paternal grandfather; Arthritis in her father and maternal grandmother; Esophageal cancer in her maternal grandmother; Pancreatic cancer in her maternal grandfather and maternal uncle; Stroke in her father and paternal uncle.  No Known Allergies     PHYSICAL EXAMINATION: Vital signs: BP 96/60 mmHg  Pulse 60  Ht 5\' 7"  (1.702 m)  Wt 149 lb (67.586 kg)  BMI 23.33 kg/m2  LMP 10/24/2015 General: Well-developed, well-nourished, no acute distress HEENT: Sclerae are anicteric, conjunctiva pink. Oral mucosa intact Lungs: Clear Heart: Regular Abdomen: soft, nontender, nondistended, no obvious ascites, no  peritoneal signs, normal bowel sounds. No organomegaly. Extremities: No edema Psychiatric: alert and oriented x3. Cooperative   ASSESSMENT:  #1. Functional dyspepsia. I do not believe that this patient has GERD. Problems may be related to anxiety. We discussed this in detail   PLAN:  #1. Okay to use carbonated beverages on demand has belching seems to help symptoms. #2. If stress or anxiety is an issue. Discussed with PCP #3. Return to the care of PCP. GI follow-up as needed  15 minutes spent face-to-face with the patient. The entire time essentially used for counseling regarding functional dyspepsia

## 2016-10-16 ENCOUNTER — Ambulatory Visit (INDEPENDENT_AMBULATORY_CARE_PROVIDER_SITE_OTHER): Payer: 59 | Admitting: Internal Medicine

## 2016-10-16 ENCOUNTER — Encounter: Payer: Self-pay | Admitting: Internal Medicine

## 2016-10-16 VITALS — BP 108/70 | HR 68 | Temp 97.9°F | Wt 143.2 lb

## 2016-10-16 DIAGNOSIS — J302 Other seasonal allergic rhinitis: Secondary | ICD-10-CM | POA: Diagnosis not present

## 2016-10-16 NOTE — Patient Instructions (Signed)
Earache, Adult An earache, or ear pain, can be caused by many things, including:  An infection.  Ear wax buildup.  Ear pressure.  Something in the ear that should not be there (foreign body).  A sore throat.  Tooth problems.  Jaw problems. Treatment of the earache will depend on the cause. If the cause is not clear or cannot be determined, you may need to watch your symptoms until your earache goes away or until a cause is found. Follow these instructions at home: Pay attention to any changes in your symptoms. Take these actions to help with your pain:  Take or apply over-the-counter and prescription medicines only as told by your health care provider.  If you were prescribed an antibiotic medicine, use it as told by your health care provider. Do not stop using the antibiotic even if you start to feel better.  Do not put anything in your ear other than medicine that is prescribed by your health care provider.  If directed, apply heat to the affected area as often as told by your health care provider. Use the heat source that your health care provider recommends, such as a moist heat pack or a heating pad.  Place a towel between your skin and the heat source.  Leave the heat on for 20-30 minutes.  Remove the heat if your skin turns bright red. This is especially important if you are unable to feel pain, heat, or cold. You may have a greater risk of getting burned.  If directed, put ice on the ear:  Put ice in a plastic bag.  Place a towel between your skin and the bag.  Leave the ice on for 20 minutes, 2-3 times a day.  Try resting in an upright position instead of lying down. This may help to reduce pressure in your ear and relieve pain.  Chew gum if it helps to relieve your ear pain.  Treat any allergies as told by your health care provider.  Keep all follow-up visits as told by your health care provider. This is important. Contact a health care provider if:  Your  pain does not improve within 2 days.  Your earache gets worse.  You have new symptoms.  You have a fever. Get help right away if:  You have a severe headache.  You have a stiff neck.  You have trouble swallowing.  You have redness or swelling behind your ear.  You have fluid or blood coming from your ear.  You have hearing loss.  You feel dizzy. This information is not intended to replace advice given to you by your health care provider. Make sure you discuss any questions you have with your health care provider. Document Released: 03/30/2004 Document Revised: 04/10/2016 Document Reviewed: 02/06/2016 Elsevier Interactive Patient Education  2017 [employer]  

## 2016-10-16 NOTE — Progress Notes (Signed)
Subjective:    Patient ID: Glenda Morrison, female    DOB: 1996/08/22, 21 y.o.   MRN: [id number]  HPI  Pt presents to the clinic today with c/o bilateral ear pain, runny nose and sneezing. This has been an ongoing issue for her, worse in the last week. She describes the ear pain as throbbing, but denies decreased hearing. She is blowing clear mucous out of her nose. She denies sore throat or cough. She denies fever, chills or body aches. She takes an allergy pill daily, but reports it doesn't really help that much. She has not had sick contacts that she is aware of.  Review of Systems      Past Medical History:  Diagnosis Date  . Frequent headaches   . GERD (gastroesophageal reflux disease)     Current Outpatient Prescriptions  Medication Sig Dispense Refill  . ferrous sulfate 325 (65 FE) MG tablet Take 325 mg by mouth daily with breakfast.     No current facility-administered medications for this visit.     No Known Allergies  Family History  Problem Relation Age of Onset  . Arthritis Father   . Stroke Father   . Pancreatic cancer Maternal Uncle   . Stroke Paternal Uncle   . Arthritis Maternal Grandmother   . Pancreatic cancer Maternal Grandfather   . Alcohol abuse Paternal Grandfather   . Esophageal cancer Maternal Grandmother   . Breast cancer      great grandmother    Social History   Social History  . Marital status: Single    Spouse name: N/A  . Number of children: N/A  . Years of education: N/A   Occupational History  . Not on file.   Social History Main Topics  . Smoking status: Never Smoker  . Smokeless tobacco: Never Used  . Alcohol use No  . Drug use: No  . Sexual activity: No   Other Topics Concern  . 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  HEENT: Pt reports ear pain and runny nose. Denies eye pain, eye redness, ringing in the ears, wax buildup, nasal congestion, bloody nose, or sore throat. Respiratory: Denies difficulty breathing, shortness of breath, cough or sputum production.     No other specific complaints in a complete review of systems (except as listed in HPI above).  Objective:   Physical Exam   BP 108/70   Pulse 68   Temp 97.9 F (36.6 C) (Oral)   Wt 143 lb 4 oz (65 kg)   LMP 10/02/2016   SpO2 99%   BMI 22.44 kg/m  Wt Readings from Last 3 Encounters:  10/16/16 143 lb 4 oz (65 kg)  11/21/15 149 lb (67.6 kg)  10/12/15 149 lb (67.6 kg)    General: Appears her stated age, well developed, well nourished in NAD. HEENT: Head: normal shape and size, no sinus tenderness noted; Ears: Tm's pink but intact, normal light reflex; Nose: mucosa pink and moist, septum midline; Throat/Mouth: Teeth present, mucosa pink and moist, no exudate, lesions or ulcerations noted.  Neck:  Bilateral anterior cervical adenopathy noted.  Cardiovascular: Normal rate and rhythm. S1,S2 noted.  No murmur, rubs or gallops noted.  Pulmonary/Chest: Normal effort and positive  vesicular breath sounds. No respiratory distress. No wheezes, rales or ronchi noted.   BMET    Component Value Date/Time   NA 137 04/04/2015 1225   NA 137 12/03/2013 0816   K 4.0 04/04/2015 1225   K 3.9 12/03/2013 0816   CL 104 04/04/2015 1225   CL 106 12/03/2013 0816   CO2 26 04/04/2015 1225   CO2 28 (H) 12/03/2013 0816   GLUCOSE 76 04/04/2015 1225   GLUCOSE 90 12/03/2013 0816   BUN 9 04/04/2015 1225   BUN 14 12/03/2013 0816   CREATININE 0.73 04/04/2015 1225   CREATININE 0.77 12/03/2013 0816   CALCIUM 9.7 04/04/2015 1225   CALCIUM 8.6 (L) 12/03/2013 0816   GFRNONAA >60 12/03/2013 0816   GFRAA >60 12/03/2013 0816    Lipid Panel     Component Value Date/Time   CHOL 156 04/04/2015 1225   TRIG  72.0 04/04/2015 1225   HDL 57.40 04/04/2015 1225   CHOLHDL 3 04/04/2015 1225   VLDL 14.4 04/04/2015 1225   LDLCALC 84 04/04/2015 1225    CBC    Component Value Date/Time   WBC 6.0 04/04/2015 1225   RBC 4.82 04/04/2015 1225   HGB 11.9 (L) 04/04/2015 1225   HGB 11.2 (L) 12/03/2013 0816   HCT 36.8 04/04/2015 1225   HCT 34.1 (L) 12/03/2013 0816   PLT 240.0 04/04/2015 1225   PLT 182 12/03/2013 0816   MCV 76.3 (L) 04/04/2015 1225   MCV 81 12/03/2013 0816   MCH 26.8 12/03/2013 0816   MCHC 32.3 04/04/2015 1225   RDW 14.8 04/04/2015 1225   RDW 13.9 12/03/2013 0816   LYMPHSABS 1.4 04/04/2015 1225   LYMPHSABS 1.2 12/03/2013 0816   MONOABS 0.6 04/04/2015 1225   MONOABS 0.5 12/03/2013 0816   EOSABS 0.2 04/04/2015 1225   EOSABS 0.2 12/03/2013 0816   BASOSABS 0.0 04/04/2015 1225   BASOSABS 0.0 12/03/2013 0816    Hgb A1C Lab Results  Component Value Date   HGBA1C 5.3 03/02/2014           Assessment & Plan:   Allergic Rhinitis:  Advised her to switch up her antihistamine Start Flonase daily Can take Ibuprofen for pain  RTC as needed or if symptoms persist or worsen Nicki Reaper, NP

## 2016-10-23 ENCOUNTER — Ambulatory Visit (INDEPENDENT_AMBULATORY_CARE_PROVIDER_SITE_OTHER): Payer: 59 | Admitting: Family Medicine

## 2016-10-23 ENCOUNTER — Encounter: Payer: Self-pay | Admitting: Family Medicine

## 2016-10-23 VITALS — BP 102/64 | HR 80 | Temp 97.6°F | Ht 67.0 in | Wt 146.4 lb

## 2016-10-23 DIAGNOSIS — Z01419 Encounter for gynecological examination (general) (routine) without abnormal findings: Secondary | ICD-10-CM | POA: Diagnosis not present

## 2016-10-23 DIAGNOSIS — J3089 Other allergic rhinitis: Secondary | ICD-10-CM

## 2016-10-23 LAB — COMPREHENSIVE METABOLIC PANEL
ALT: 13 U/L (ref 0–35)
AST: 21 U/L (ref 0–37)
Albumin: 4.4 g/dL (ref 3.5–5.2)
Alkaline Phosphatase: 64 U/L (ref 39–117)
BILIRUBIN TOTAL: 0.4 mg/dL (ref 0.2–1.2)
BUN: 9 mg/dL (ref 6–23)
CALCIUM: 9.3 mg/dL (ref 8.4–10.5)
CO2: 27 meq/L (ref 19–32)
CREATININE: 0.72 mg/dL (ref 0.40–1.20)
Chloride: 104 mEq/L (ref 96–112)
GFR: 108.2 mL/min (ref >60.00)
Glucose, Bld: 88 mg/dL (ref 70–99)
Potassium: 4.1 mEq/L (ref 3.5–5.1)
Sodium: 139 mEq/L (ref 135–145)
Total Protein: 7.1 g/dL (ref 6.0–8.3)

## 2016-10-23 LAB — CBC WITH DIFFERENTIAL/PLATELET
BASOS ABS: 0 10*3/uL (ref 0.0–0.1)
Basophils Relative: 0.7 % (ref 0.0–3.0)
EOS ABS: 0.3 10*3/uL (ref 0.0–0.7)
Eosinophils Relative: 4.6 % (ref 0.0–5.0)
HEMATOCRIT: 38.2 % (ref 36.0–46.0)
HEMOGLOBIN: 13 g/dL (ref 12.0–15.0)
LYMPHS PCT: 19.2 % (ref 12.0–46.0)
Lymphs Abs: 1.2 10*3/uL (ref 0.7–4.0)
MCHC: 33.9 g/dL (ref 30.0–36.0)
MCV: 83.8 fl (ref 78.0–100.0)
Monocytes Absolute: 0.6 10*3/uL (ref 0.1–1.0)
Monocytes Relative: 9.2 % (ref 3.0–12.0)
NEUTROS ABS: 4.3 10*3/uL (ref 1.4–7.7)
Neutrophils Relative %: 66.3 % (ref 43.0–77.0)
PLATELETS: 228 10*3/uL (ref 150.0–400.0)
RBC: 4.56 Mil/uL (ref 3.87–5.11)
RDW: 14.3 % (ref 11.5–15.5)
WBC: 6.5 10*3/uL (ref 4.0–10.5)

## 2016-10-23 LAB — LIPID PANEL
CHOL/HDL RATIO: 2
Cholesterol: 153 mg/dL (ref 0–200)
HDL: 63.7 mg/dL (ref >39.00)
LDL Cholesterol: 80 mg/dL (ref 0–99)
NONHDL: 89.52
TRIGLYCERIDES: 46 mg/dL (ref 0.0–149.0)
VLDL: 9.2 mg/dL (ref 0.0–40.0)

## 2016-10-23 LAB — VITAMIN B12: VITAMIN B 12: 519 pg/mL (ref 211–911)

## 2016-10-23 LAB — TSH: TSH: 2.21 u[IU]/mL (ref 0.35–4.50)

## 2016-10-23 LAB — VITAMIN D 25 HYDROXY (VIT D DEFICIENCY, FRACTURES): VITD: 23.61 ng/mL — AB (ref 30.00–100.00)

## 2016-10-23 NOTE — Patient Instructions (Addendum)
Great to see you. I will call you with your lab results.  Please schedule a pap smear at your convenience.

## 2016-10-23 NOTE — Assessment & Plan Note (Signed)
Reviewed preventive care protocols, scheduled due services, and updated immunizations Discussed nutrition, exercise, diet, and healthy lifestyle.  Orders Placed This Encounter  Procedures  . CBC with Differential/Platelet  . Comprehensive metabolic panel  . Lipid panel  . TSH  . Vitamin B12  . Vitamin D, 25-hydroxy    

## 2016-10-23 NOTE — Progress Notes (Addendum)
Subjective:   Patient ID: Genia Harold, female    DOB: 02-15-1996, 21 y.o.   MRN: [id number]  Jennetta Flood is a pleasant 21 y.o. year old female who presents to clinic today with Annual Exam  on 10/23/2016  HPI:  G0- virginal. Has never had a pap smear.  She is taking iron tablets, periods heavy.  Sometimes dizzy around her period.  She is having more allergy symptoms despite taking OTC antihistamine. Asked to be referred to an allergist.   Current Outpatient Prescriptions on File Prior to Visit  Medication Sig Dispense Refill  . ferrous sulfate 325 (65 FE) MG tablet Take 325 mg by mouth daily with breakfast.     No current facility-administered medications on file prior to visit.     No Known Allergies  Past Medical History:  Diagnosis Date  . Frequent headaches   . GERD (gastroesophageal reflux disease)     Past Surgical History:  Procedure Laterality Date  . WISDOM TOOTH EXTRACTION      Family History  Problem Relation Age of Onset  . Arthritis Father   . Stroke Father   . Pancreatic cancer Maternal Uncle   . Stroke Paternal Uncle   . Arthritis Maternal Grandmother   . Esophageal cancer Maternal Grandmother   . Pancreatic cancer Maternal Grandfather   . Alcohol abuse Paternal Grandfather   . Breast cancer      great grandmother    Social History   Social History  . Marital status: Single    Spouse name: N/A  . Number of children: N/A  . Years of education: N/A   Occupational History  . Not on file.   Social History Main Topics  . Smoking status: Never Smoker  . Smokeless tobacco: Never Used  . Alcohol use No  . Drug use: No  . Sexual activity: No   Other Topics Concern  . 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   Review of Systems  Constitutional: Negative.   HENT: Negative.   Eyes: Negative.   Respiratory: Negative.   Cardiovascular: Negative.   Gastrointestinal: Negative.   Endocrine: Negative.   Genitourinary: Negative.   Musculoskeletal: Negative.   Skin: Negative.   Allergic/Immunologic: Negative.   Neurological: Negative.   Hematological: Negative.   Psychiatric/Behavioral: Negative.   All other systems reviewed and are negative.      Objective:    BP 102/64 (BP Location: Right Arm, Patient Position: Sitting, Cuff Size: Normal)   Pulse 80   Temp 97.6 F (36.4 C) (Oral)   Ht 5\' 7"  (1.702 m)   Wt 146 lb 6.4 oz (66.4 kg)   LMP 10/02/2016   SpO2 100%   BMI 22.93 kg/m    Physical Exam    General:  Well-developed,well-nourished,in no acute distress; alert,appropriate and cooperative throughout examination Head:  normocephalic and atraumatic.   Eyes:  vision grossly intact, PERRL Ears:  R ear normal and L ear normal externally, TMs clear bilaterally Nose:  no external deformity.   Mouth:  good dentition.   Neck:  No deformities, masses, or tenderness noted. Lungs:  Normal respiratory effort, chest expands symmetrically. Lungs are clear to  auscultation, no crackles or wheezes. Heart:  Normal rate and regular rhythm. S1 and S2 normal without gallop, murmur, click, rub or other extra sounds. Abdomen:  Bowel sounds positive,abdomen soft and non-tender without masses, organomegaly or hernias noted. Msk:  No deformity or scoliosis noted of thoracic or lumbar spine.   Extremities:  No clubbing, cyanosis, edema, or deformity noted with normal full range of motion of all joints.   Neurologic:  alert & oriented X3 and gait normal.   Skin:  Intact without suspicious lesions or rashes Cervical Nodes:  No lymphadenopathy noted Axillary Nodes:  No palpable lymphadenopathy Psych:  Cognition and  judgment appear intact. Alert and cooperative with normal attention span and concentration. No apparent delusions, illusions, hallucinations      Assessment & Plan:   Well woman exam No Follow-up on file.

## 2016-10-23 NOTE — Addendum Note (Signed)
Addended by: Dianne DunARON, TALIA M on: 10/23/2016 09:22 AM   Modules accepted: Orders

## 2016-10-23 NOTE — Progress Notes (Signed)
Pre visit review using our clinic review tool, if applicable. No additional management support is needed unless otherwise documented below in the visit note. 

## 2016-10-24 ENCOUNTER — Telehealth: Payer: Self-pay | Admitting: Family Medicine

## 2016-10-24 NOTE — Telephone Encounter (Signed)
Pt returned call about labs please call [phone] Thanks

## 2016-10-30 ENCOUNTER — Encounter: Payer: Self-pay | Admitting: Family Medicine

## 2016-11-20 DIAGNOSIS — J3089 Other allergic rhinitis: Secondary | ICD-10-CM | POA: Diagnosis not present

## 2016-11-20 DIAGNOSIS — J4520 Mild intermittent asthma, uncomplicated: Secondary | ICD-10-CM | POA: Diagnosis not present

## 2016-11-20 DIAGNOSIS — J309 Allergic rhinitis, unspecified: Secondary | ICD-10-CM | POA: Diagnosis not present

## 2017-02-20 ENCOUNTER — Ambulatory Visit (INDEPENDENT_AMBULATORY_CARE_PROVIDER_SITE_OTHER)
Admission: RE | Admit: 2017-02-20 | Discharge: 2017-02-20 | Disposition: A | Discharge status: Home / Self Care | Payer: 59 | Source: Ambulatory Visit | Attending: Family Medicine | Admitting: Family Medicine

## 2017-02-20 ENCOUNTER — Encounter: Payer: Self-pay | Admitting: Family Medicine

## 2017-02-20 ENCOUNTER — Ambulatory Visit (INDEPENDENT_AMBULATORY_CARE_PROVIDER_SITE_OTHER): Payer: 59 | Admitting: Family Medicine

## 2017-02-20 ENCOUNTER — Other Ambulatory Visit: Payer: Self-pay | Admitting: Family Medicine

## 2017-02-20 DIAGNOSIS — M25561 Pain in right knee: Secondary | ICD-10-CM

## 2017-02-20 DIAGNOSIS — G8929 Other chronic pain: Secondary | ICD-10-CM | POA: Diagnosis not present

## 2017-02-20 HISTORY — DX: Pain in right knee: M25.561

## 2017-02-20 MED ORDER — FLUTICASONE PROPIONATE 50 MCG/ACT NA SUSP: 2.0000 | Freq: Every day | NASAL | 6 refills | Status: DC

## 2017-02-20 MED ORDER — FEXOFENADINE HCL 180 MG PO TABS: 180.0000 mg | ORAL_TABLET | Freq: Every day | ORAL | 3 refills | Status: DC

## 2017-02-20 NOTE — Progress Notes (Signed)
SUBJECTIVE: Glenda Morrison is a 21 y.o. female who has been experiencing right knee pain intermittently for months.   Feels pain is worse when it rains outside.  She has a physical job- works at her [employer]. No know definitive injury.  Ibuprofen does help some.   Pain is also more noticeable with weight bearing.   Symptoms have been intermittent since that time. Prior history of related problems: no prior problems with this area in the past.  Current Outpatient Prescriptions on File Prior to Visit  Medication Sig Dispense Refill  . ferrous sulfate 325 (65 FE) MG tablet Take 325 mg by mouth daily with breakfast.     No current facility-administered medications on file prior to visit.     No Known Allergies  Past Medical History:  Diagnosis Date  . Frequent headaches   . GERD (gastroesophageal reflux disease)     Past Surgical History:  Procedure Laterality Date  . WISDOM TOOTH EXTRACTION      Family History  Problem Relation Age of Onset  . Arthritis Father   . Stroke Father   . Pancreatic cancer Maternal Uncle   . Stroke Paternal Uncle   . Arthritis Maternal Grandmother   . Esophageal cancer Maternal Grandmother   . Pancreatic cancer Maternal Grandfather   . Alcohol abuse Paternal Grandfather   . Breast cancer Unknown        great grandmother    Social History   Social History  . Marital status: Single    Spouse name: N/A  . Number of children: N/A  . Years of education: N/A   Occupational History  . Not on file.   Social History Main Topics  . Smoking status: Never Smoker  . Smokeless tobacco: Never Used  . Alcohol use No  . Drug use: No  . Sexual activity: No   Other Topics Concern  . 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  OBJECTIVE: Vital signs as noted above. Appearance: alert, well appearing, and in no distress. Knee exam: normal exam, no swelling, tenderness, instability; ligaments intact, FROM, negative drawer sign, normal contralateral knee exam. X-ray: ordered, but results not yet available.  ASSESSMENT: Knee meniscal injury and internal derangement  PLAN: Exam unremarkable today - pt is not feeling much pain today.  She did take Ibuprofen. rest the injured area as much as practical, X-Ray ordered See orders for this visit as documented in the electronic medical record.

## 2017-03-05 ENCOUNTER — Encounter: Payer: Self-pay | Admitting: Family Medicine

## 2017-03-05 ENCOUNTER — Ambulatory Visit (INDEPENDENT_AMBULATORY_CARE_PROVIDER_SITE_OTHER): Payer: 59 | Admitting: Family Medicine

## 2017-03-05 DIAGNOSIS — L02412 Cutaneous abscess of left axilla: Secondary | ICD-10-CM | POA: Insufficient documentation

## 2017-03-05 MED ORDER — DOXYCYCLINE HYCLATE 100 MG PO TABS: 100.0000 mg | ORAL_TABLET | Freq: Two times a day (BID) | ORAL | 0 refills | Status: DC

## 2017-03-05 NOTE — Assessment & Plan Note (Signed)
New- nonfluctuant. No indication for I and D at this time. Place on oral doxycyline. Advised warm compresses. Call or return to clinic prn if these symptoms worsen or fail to improve as anticipated. The patient indicates understanding of these issues and agrees with the plan.

## 2017-03-05 NOTE — Progress Notes (Signed)
Subjective:   Patient ID: Glenda Morrison, female    DOB: 12/31/1995, 21 y.o.   MRN: [id number]  Glenda Morrison is a pleasant 21 y.o. year old female who presents to clinic today with arm pit bump  on 03/05/2017  HPI:  Bump under left armpit for past two days. Growing in size and more painful.  Mild warmth and erythema but she is not sure if that's just because she has been working outside.  Has never had anything like this before but her brother has had several abscesses I and D'd (some in OR).  Has placed tea tree oil on it without much improvement.  No fever. No nausea or vomiting.  No myalgias or chills.  Current Outpatient Prescriptions on File Prior to Visit  Medication Sig Dispense Refill  . ferrous sulfate 325 (65 FE) MG tablet Take 325 mg by mouth daily with breakfast.    . fexofenadine (ALLEGRA ALLERGY) 180 MG tablet Take 1 tablet (180 mg total) by mouth daily. 30 tablet 3  . fluticasone (FLONASE) 50 MCG/ACT nasal spray Place 2 sprays into both nostrils daily. 16 g 6   No current facility-administered medications on file prior to visit.     No Known Allergies  Past Medical History:  Diagnosis Date  . Frequent headaches   . GERD (gastroesophageal reflux disease)     Past Surgical History:  Procedure Laterality Date  . WISDOM TOOTH EXTRACTION      Family History  Problem Relation Age of Onset  . Arthritis Father   . Stroke Father   . Pancreatic cancer Maternal Uncle   . Stroke Paternal Uncle   . Arthritis Maternal Grandmother   . Esophageal cancer Maternal Grandmother   . Pancreatic cancer Maternal Grandfather   . Alcohol abuse Paternal Grandfather   . Breast cancer Unknown        great grandmother    Social History   Social History  . Marital status: Single    Spouse name: N/A  . Number of children: N/A  . Years of education: N/A   Occupational History  . Not on file.   Social History Main Topics  . Smoking status: Never Smoker  .  Smokeless tobacco: Never Used  . Alcohol use No  . Drug use: No  . Sexual activity: No   Other Topics Concern  . 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   Review of Systems  Constitutional: Negative for fatigue and fever.  Respiratory: Negative.   Cardiovascular: Negative.   Gastrointestinal: Negative.   Skin: Positive for color change.  Neurological: Negative.   Hematological: Negative.  Negative for adenopathy.  Psychiatric/Behavioral: Negative.   All other systems reviewed and are negative.      Objective:    BP 120/62   Pulse 60   Ht 5\' 7"  (1.702 m)   Wt 141 lb (64 kg)   LMP 02/20/2017 (Exact Date)   SpO2 98%   BMI 22.08 kg/m    Physical Exam  Constitutional: She is oriented to person, place, and time. She appears well-developed and well-nourished. No distress.  HENT:  Head: Normocephalic and atraumatic.  Eyes: Conjunctivae are normal.  Cardiovascular: Normal rate.   Pulmonary/Chest: Effort normal.  Musculoskeletal: Normal range of motion.  Neurological: She is alert and oriented to person, place, and time. No cranial nerve deficit.  Skin: She is not diaphoretic.     Psychiatric: She has a normal mood and affect. Her behavior is normal. Judgment and thought content normal.  Nursing note and vitals reviewed.         Assessment & Plan:   Abscess of left axilla No Follow-up on file.

## 2017-03-05 NOTE — Patient Instructions (Signed)
Great to see you.  Take doxycyline as directed- 1 tablet twice daily for 10 days.

## 2017-03-08 ENCOUNTER — Telehealth: Payer: Self-pay

## 2017-03-08 MED ORDER — SULFAMETHOXAZOLE-TRIMETHOPRIM 800-160 MG PO TABS: 1.0000 | ORAL_TABLET | Freq: Two times a day (BID) | ORAL | 0 refills | Status: DC

## 2017-03-08 NOTE — Telephone Encounter (Signed)
She said the area is smaller and not painful at all. Notified of your recommendations pt verbalized understanding.

## 2017-03-08 NOTE — Telephone Encounter (Signed)
All antibiotics have the potential to cause these side effects. How is the area under her armpit?  Smaller?  Less painful.  Will d/c doxycycline and send in 5 day course of bactrim to complete her abx course.

## 2017-03-08 NOTE — Telephone Encounter (Signed)
Pt was seen 03/05/17 and has been taking doxycycline; pt has taken doxycycline on an empty stomach and also taken with food and the feelings are the same; nausea, feels like stomach is being eaten up, weak and dizzy. Pt does not want to feel like this for 10 days and request cb. [facility] [address]

## 2017-06-05 ENCOUNTER — Encounter: Payer: Self-pay | Admitting: Family Medicine

## 2017-06-05 ENCOUNTER — Ambulatory Visit (INDEPENDENT_AMBULATORY_CARE_PROVIDER_SITE_OTHER): Payer: 59 | Admitting: Family Medicine

## 2017-06-05 DIAGNOSIS — R21 Rash and other nonspecific skin eruption: Secondary | ICD-10-CM | POA: Insufficient documentation

## 2017-06-05 MED ORDER — FLUOCINONIDE-E 0.05 % EX CREA: 1.0000 "application " | TOPICAL_CREAM | Freq: Two times a day (BID) | CUTANEOUS | 0 refills | Status: DC

## 2017-06-05 NOTE — Progress Notes (Signed)
Subjective:   Patient ID: Glenda Morrison, female    DOB: 02/06/1996, 21 y.o.   MRN: [id number]  Glenda Morrison is a pleasant 21 y.o. year old female who presents to clinic today with Rash (Patient is here today C/O a rash that is intermittent on her abd x2wks.  Itches.)  on 06/05/2017  HPI:  Rash on her abdomen x 2 weeks.  Very itchy, started on her chest, now on her abdomen.  Has not really tried taking anything for it.  She is allergic to dust mites.  No changes in soaps or detergents.   Current Outpatient Prescriptions on File Prior to Visit  Medication Sig Dispense Refill  . ferrous sulfate 325 (65 FE) MG tablet Take 325 mg by mouth daily with breakfast.    . fexofenadine (ALLEGRA ALLERGY) 180 MG tablet Take 1 tablet (180 mg total) by mouth daily. 30 tablet 3  . fluticasone (FLONASE) 50 MCG/ACT nasal spray Place 2 sprays into both nostrils daily. 16 g 6   No current facility-administered medications on file prior to visit.     No Known Allergies  Past Medical History:  Diagnosis Date  . Frequent headaches   . GERD (gastroesophageal reflux disease)     Past Surgical History:  Procedure Laterality Date  . WISDOM TOOTH EXTRACTION      Family History  Problem Relation Age of Onset  . Arthritis Father   . Stroke Father   . Pancreatic cancer Maternal Uncle   . Stroke Paternal Uncle   . Arthritis Maternal Grandmother   . Esophageal cancer Maternal Grandmother   . Pancreatic cancer Maternal Grandfather   . Alcohol abuse Paternal Grandfather   . Breast cancer Unknown        great grandmother    Social History   Social History  . Marital status: Single    Spouse name: N/A  . Number of children: N/A  . Years of education: N/A   Occupational History  . Not on file.   Social History Main Topics  . Smoking status: Never Smoker  . Smokeless tobacco: Never Used  . Alcohol use No  . Drug use: No  . Sexual activity: No   Other Topics Concern  . 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   Review of Systems  Skin: Positive for rash.  All other systems reviewed and are negative.      Objective:    BP 114/66 (BP Location: Left Arm, Patient Position: Sitting, Cuff Size: Normal)   Pulse (!) 57   Temp 97.7 F (36.5 C) (Oral)   Ht  (1.702 m)   Wt 144 lb 1.9 oz (65.4 kg)   LMP 05/22/2017   SpO2 98%   BMI 22.57 kg/m    Physical Exam  Constitutional: She is oriented to person, place, and time. She appears well-developed and well-nourished. No distress.  HENT:  Head: Normocephalic and atraumatic.  Eyes: Conjunctivae are normal.  Cardiovascular: Normal rate.   Pulmonary/Chest: Effort normal.  Neurological: She is alert and oriented to person, place, and time. No cranial nerve deficit.  Skin: Skin is warm and dry. Rash noted. Rash is urticarial.  She is not diaphoretic.  Psychiatric: She has a normal mood and affect. Her behavior is normal. Judgment and thought content normal.  Nursing note and vitals reviewed.         Assessment & Plan:   Rash and other nonspecific skin eruption No Follow-up on file.

## 2017-06-05 NOTE — Assessment & Plan Note (Signed)
New- does appear allergic. Unclear if due to dust mites. Treat with topical lidex twice daily and as needed antihistamines for no longer than 10 days. Call or return to clinic prn if these symptoms worsen or fail to improve as anticipated. The patient indicates understanding of these issues and agrees with the plan.

## 2017-06-05 NOTE — Patient Instructions (Signed)
Great to see you.  Please start taking taking Benadryl and or zyrtec daily for next few days.  Lidex twice daily for no more than 10 days without letting me know.

## 2017-06-18 ENCOUNTER — Telehealth: Payer: Self-pay | Admitting: Family Medicine

## 2017-06-18 NOTE — Telephone Encounter (Signed)
Copied from CRM #[id number]. Topic: Quick Communication - Appointment Cancellation >> Jun 18, 2017 10:38 AM Arlyss Gandyichardson, Taren N, NT wrote: Patient called to cancel appointment scheduled for 06/19/17. Patient has not rescheduled their appointment.   Route to department's PEC pool.

## 2017-06-19 ENCOUNTER — Ambulatory Visit: Payer: 59 | Admitting: Family Medicine

## 2018-02-06 IMAGING — DX DG KNEE COMPLETE 4+V*R*
4 series · 4 of 4 positions shown · non-contrast
Comparison: None in PACs

CLINICAL DATA: Right knee pain and swelling, chronic

EXAM:
RIGHT KNEE - COMPLETE 4+ VIEW

[knee ap]
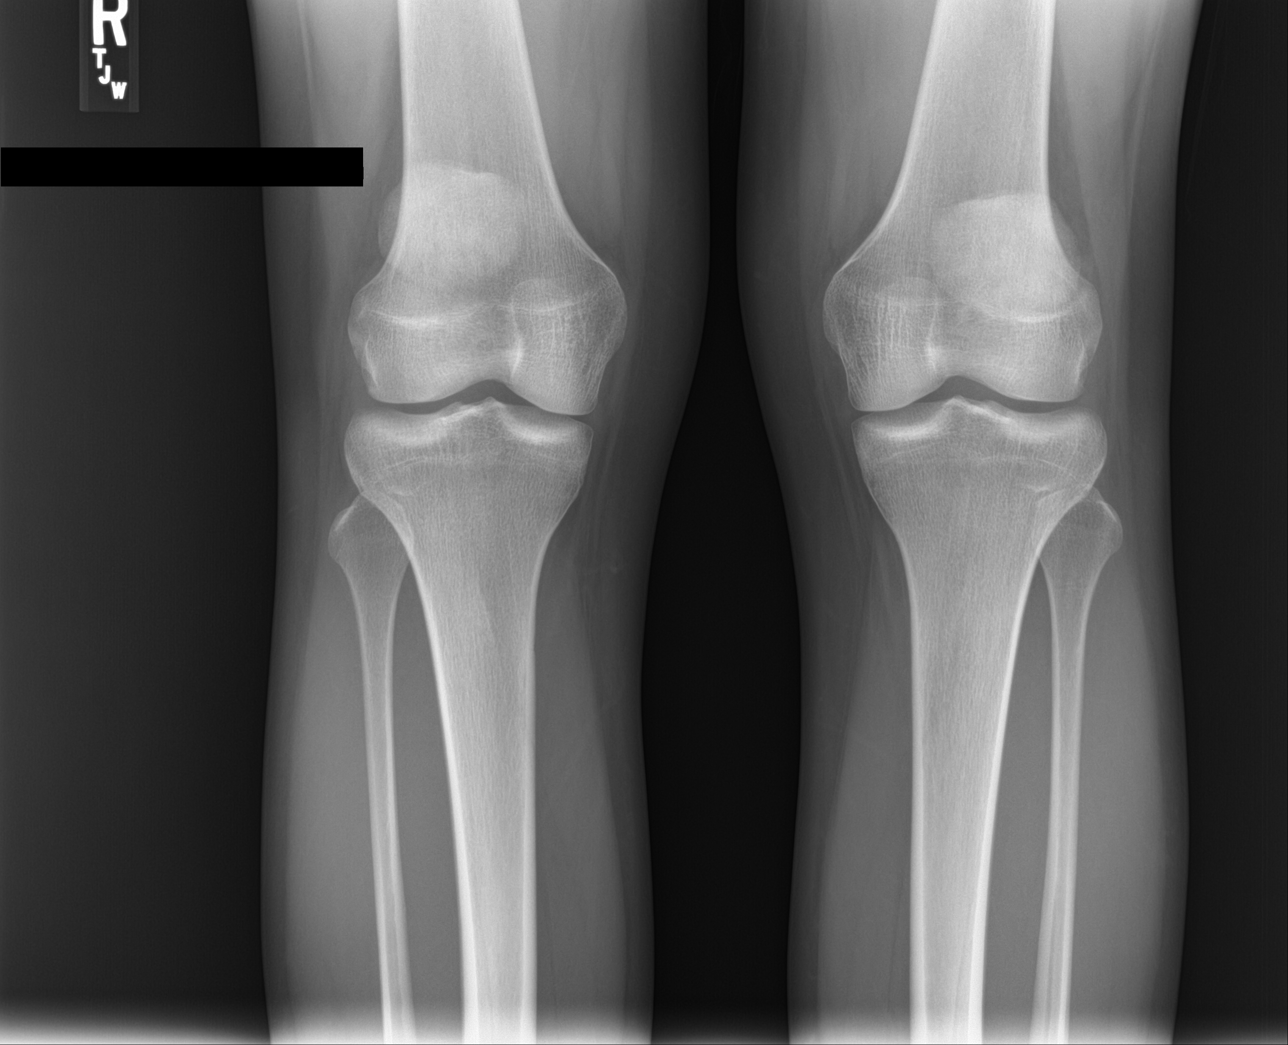

[knee lat]
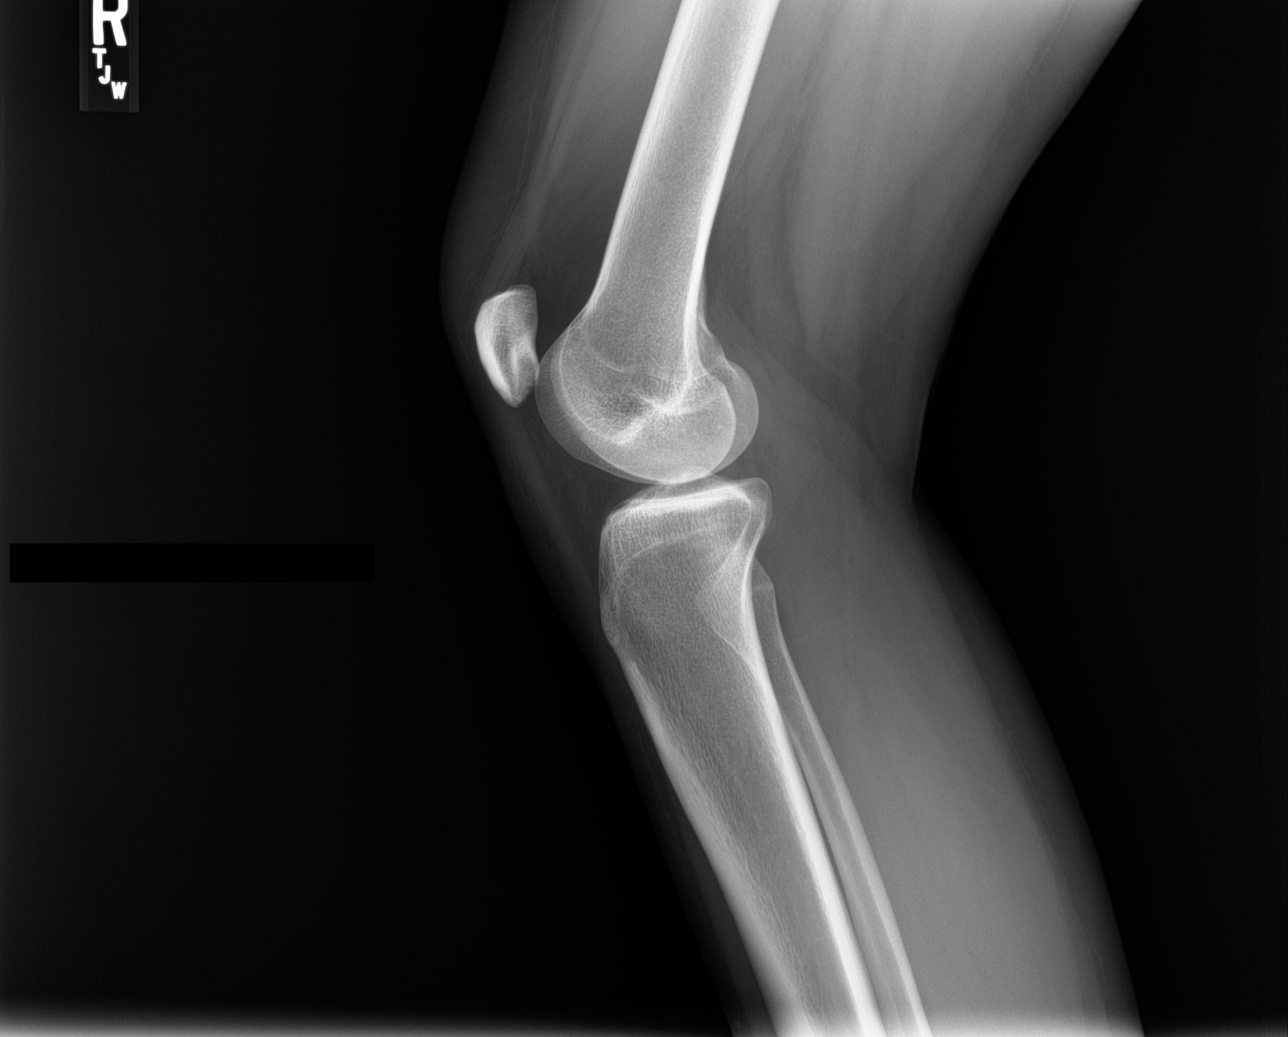

[patella skyline]
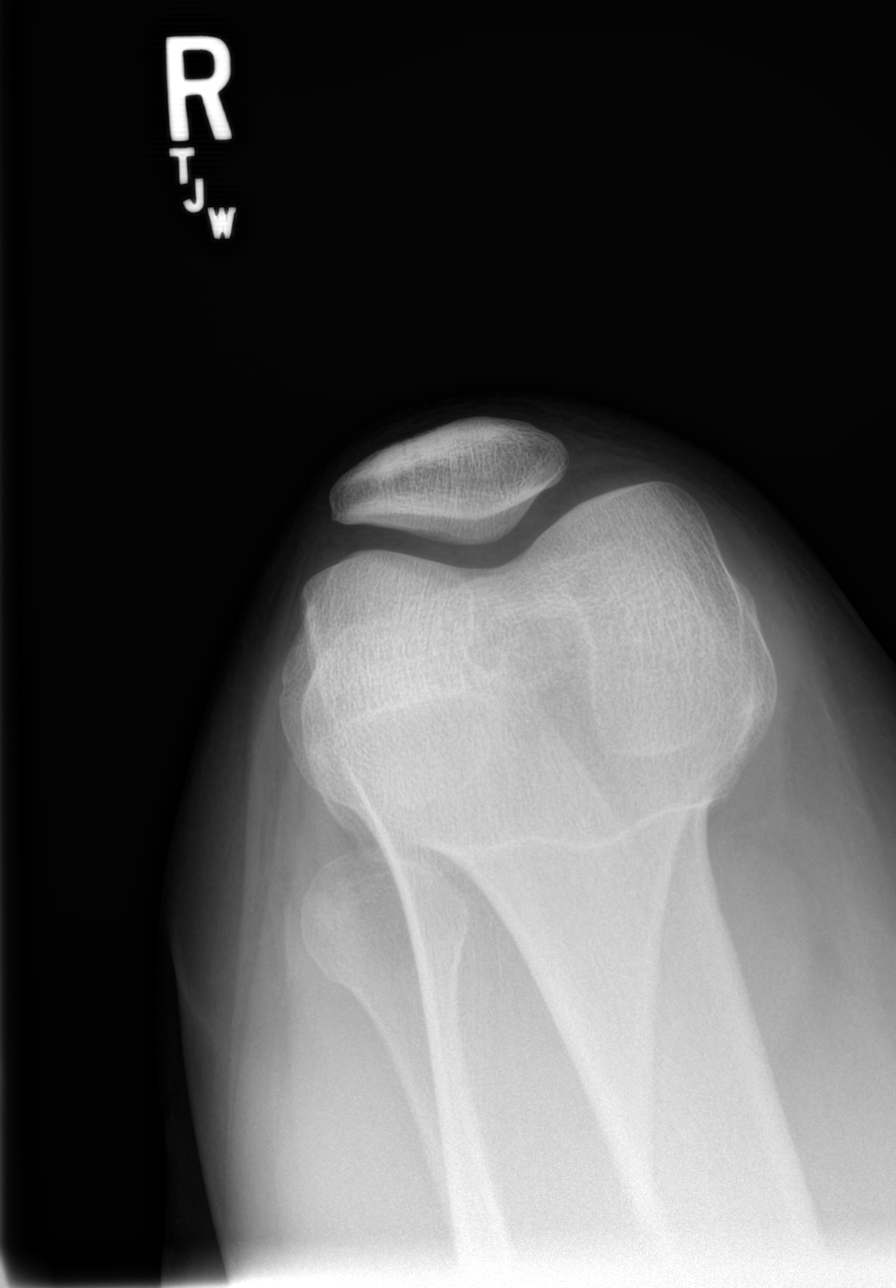

[knee obl]
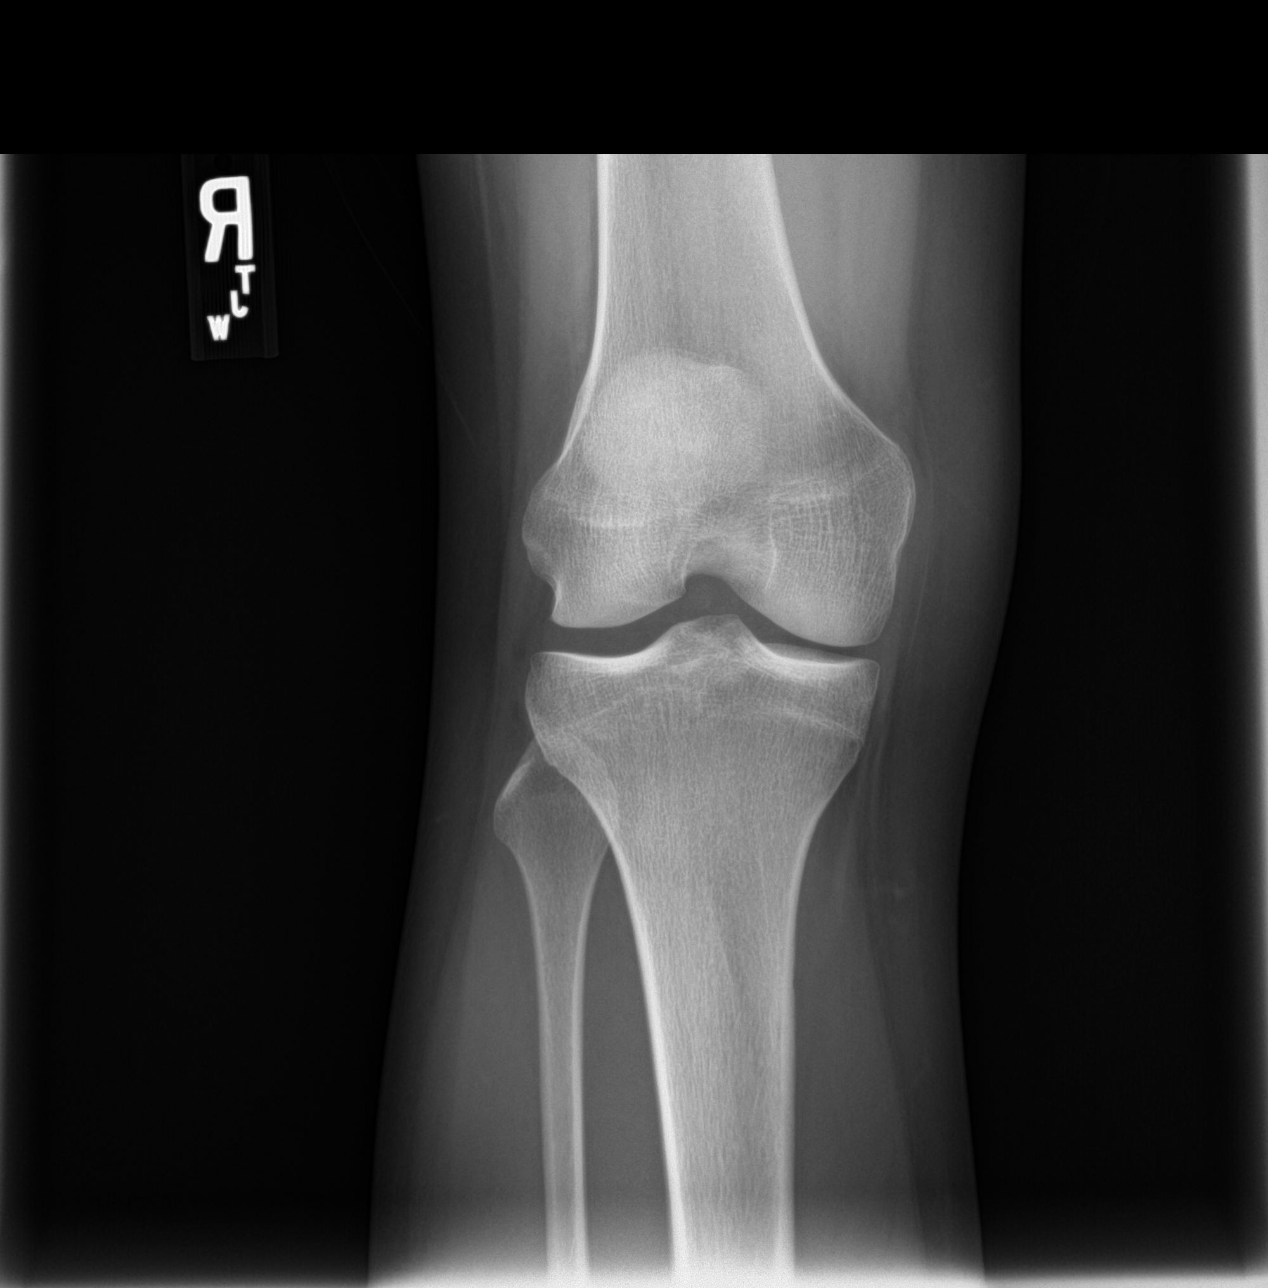

[4 of 4 positions shown; findings below may reference images not displayed]

FINDINGS: The bones are subjectively adequately mineralized. The joint spaces
are well maintained. There is no joint effusion. There is no acute
or healing fracture. No significant osteophyte formation is
observed.
IMPRESSION: There is no acute or significant chronic bony abnormality of the
right knee. The AP view of the left knee is unremarkable as well.

## 2018-03-12 DIAGNOSIS — S71112A Laceration without foreign body, left thigh, initial encounter: Secondary | ICD-10-CM | POA: Diagnosis not present

## 2018-03-14 ENCOUNTER — Encounter: Payer: Self-pay | Admitting: Family Medicine

## 2018-03-14 ENCOUNTER — Ambulatory Visit (INDEPENDENT_AMBULATORY_CARE_PROVIDER_SITE_OTHER): Payer: 59 | Admitting: Family Medicine

## 2018-03-14 VITALS — BP 110/70 | HR 85 | Ht 67.0 in | Wt 146.0 lb

## 2018-03-14 DIAGNOSIS — J3089 Other allergic rhinitis: Secondary | ICD-10-CM

## 2018-03-14 DIAGNOSIS — F419 Anxiety disorder, unspecified: Secondary | ICD-10-CM

## 2018-03-14 DIAGNOSIS — R040 Epistaxis: Secondary | ICD-10-CM

## 2018-03-14 DIAGNOSIS — S71112A Laceration without foreign body, left thigh, initial encounter: Secondary | ICD-10-CM

## 2018-03-14 NOTE — Progress Notes (Signed)
Subjective:    Patient ID: Glenda Morrison, female    DOB: [birth year], 22 y.o.   MRN: [id number]  HPI This is a 22 yo female who presents today to establish care, transferring from Dr. Dayton MartesAron. 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 Third youngest of 2111 children.   Has had frequent epistaxis since childhood. Seemed to stop but started back. Has never seen ENT. More frequently from left nare. Over last 1 1/2 year has had increased nose bleeds. Had severe nose bleed a couple of days ago. Has frequent sniffling. Has developed tick. Taking OTC 24 hour allergy medicine, ? Cetirizine. Has seen allergist in past. Doesn't use flonase regularly. Didn't seem to alter frequency of nose bleeds. Normal CBC last year, no easy bleeding from other sources, no easy bruising.   She got a nail in her left upper thigh and had stitches two days ago. Has been keeping wrapped. Painful. Taking ibuprofen 800 mg 3x/ day. Still working.   Last CPE- unknown Pap- never Tdap- 03/12/18 Flu- occasionally Eye- once a year Dental- last year Exercise- runs, stretching Diet- cooks at home Stress- always stressed, family and exercise help, definitely worse with periods, feel withdrawn Sex- not sexually active   Past Medical History:  Diagnosis Date  . Frequent headaches   . GERD (gastroesophageal reflux disease)    Past Surgical History:  Procedure Laterality Date  . WISDOM TOOTH EXTRACTION     Family History  Problem Relation Age of Onset  . Arthritis Father   . Stroke Father   . Pancreatic cancer Maternal Uncle   . Stroke Paternal Uncle   . Arthritis Maternal Grandmother   . Esophageal cancer Maternal Grandmother   . Pancreatic cancer Maternal Grandfather   . Alcohol abuse Paternal Grandfather   . Breast cancer Unknown        great grandmother   Social History   Tobacco Use  . Smoking status: Never Smoker  . Smokeless tobacco: Never Used  Substance Use Topics  . Alcohol use:  No    Alcohol/week: 0.0 oz  . Drug use: No      Review of Systems Per HPI    Objective:   Physical Exam  Constitutional: She is oriented to person, place, and time. She appears well-developed and well-nourished. No distress.  HENT:  Head: Normocephalic and atraumatic.  Nose: Nose normal.  Neck: Normal range of motion. Neck supple.  Cardiovascular: Normal rate.  Pulmonary/Chest: Effort normal.  Neurological: She is alert and oriented to person, place, and time.  Skin: Skin is warm and dry. She is not diaphoretic.  Left anterior thigh with laceration, sutures intact. No drainage. Moderate ecchymosis and mild erythema.   Psychiatric: She has a normal mood and affect. Her behavior is normal. Thought content normal.  Vitals reviewed.     BP 110/70 (BP Location: Right Arm, Patient Position: Sitting, Cuff Size: Normal)   Pulse 85   Ht 5\' 7"  (1.702 m)   Wt 146 lb (66.2 kg)   LMP 02/14/2018   SpO2 99%   BMI 22.87 kg/m  Wt Readings from Last 3 Encounters:  03/14/18 146 lb (66.2 kg)  06/05/17 144 lb 1.9 oz (65.4 kg)  03/05/17 141 lb (64 kg)       Assessment & Plan:  1. Epistaxis - Ambulatory referral to ENT  2. Laceration of left thigh, initial encounter - discussed s/s infection, follow up for suture removal as planned - encouraged her to be off leg  more to decrease pain  3. Anxiety - provided contact information for therapists and encouraged continued regular exercise, open dialogue with support systems  4. Non-seasonal allergic rhinitis, unspecified trigger - continue otc antihistamine - add Flonase Sensamist   - follow up for CPE/PAP   Olean Ree, FNP-BC  [facility], [facility]  03/14/2018 9:01 AM

## 2018-03-14 NOTE — Patient Instructions (Addendum)
Try Flonase Sensimist two sprays in each nostril at bedtime for at least 3-4 weeks  Let me know what inhaler you use    Schedule your complete physical

## 2018-03-19 DIAGNOSIS — S71112D Laceration without foreign body, left thigh, subsequent encounter: Secondary | ICD-10-CM | POA: Diagnosis not present

## 2018-03-19 DIAGNOSIS — Z4802 Encounter for removal of sutures: Secondary | ICD-10-CM | POA: Diagnosis not present

## 2018-03-19 DIAGNOSIS — L03116 Cellulitis of left lower limb: Secondary | ICD-10-CM | POA: Diagnosis not present

## 2018-03-21 DIAGNOSIS — S71112D Laceration without foreign body, left thigh, subsequent encounter: Secondary | ICD-10-CM | POA: Diagnosis not present

## 2018-03-21 DIAGNOSIS — T8130XA Disruption of wound, unspecified, initial encounter: Secondary | ICD-10-CM | POA: Diagnosis not present

## 2018-04-02 ENCOUNTER — Ambulatory Visit: Payer: 59 | Admitting: Family Medicine

## 2018-04-15 DIAGNOSIS — R040 Epistaxis: Secondary | ICD-10-CM | POA: Diagnosis not present

## 2018-04-15 DIAGNOSIS — J310 Chronic rhinitis: Secondary | ICD-10-CM | POA: Diagnosis not present

## 2018-04-16 ENCOUNTER — Encounter: Payer: 59 | Attending: Internal Medicine | Admitting: Internal Medicine

## 2018-04-16 DIAGNOSIS — M1990 Unspecified osteoarthritis, unspecified site: Secondary | ICD-10-CM | POA: Diagnosis not present

## 2018-04-16 DIAGNOSIS — J45909 Unspecified asthma, uncomplicated: Secondary | ICD-10-CM | POA: Insufficient documentation

## 2018-04-16 DIAGNOSIS — L97121 Non-pressure chronic ulcer of left thigh limited to breakdown of skin: Secondary | ICD-10-CM | POA: Insufficient documentation

## 2018-04-16 DIAGNOSIS — S71102A Unspecified open wound, left thigh, initial encounter: Secondary | ICD-10-CM | POA: Diagnosis not present

## 2018-04-16 DIAGNOSIS — L98499 Non-pressure chronic ulcer of skin of other sites with unspecified severity: Secondary | ICD-10-CM | POA: Diagnosis present

## 2018-04-25 NOTE — Progress Notes (Signed)
Glenda Morrison, Shilynn ([id number]) Visit Report for 04/16/2018 Allergy List Details Patient Name: Glenda Morrison, Glenda Morrison Date of Service: 04/16/2018 8:00 AM Medical Record Number: [id number] Patient Account Number: [id number] Date of Birth/Sex: 05/25/1996 (21 y.o. Female) Treating RN: Phillis HaggisPinkerton, Debi Primary Care Hawley Pavia: Ruthe MannanAron, Talia Other Clinician: Referring Danity Schmelzer: Glenna FellowsELSTON, SCOTT Treating Addley Ballinger/Extender: Maxwell CaulOBSON, MICHAEL G Weeks in Treatment: 0 Allergies Active Allergies NKDA Allergy Notes Electronic Signature(s) Signed: 04/16/2018 4:43:18 PM By: Alejandro MullingPinkerton, Debra Entered By: Alejandro MullingPinkerton, Debra on 04/16/2018 08:30:50 Glenda Morrison, Glenda Morrison ([id number]) -------------------------------------------------------------------------------- Arrival Information Details Patient Name: Glenda Morrison, Glenda Morrison Date of Service: 04/16/2018 8:00 AM Medical Record Number: [id number] Patient Account Number: [id number] Date of Birth/Sex: 05/25/1996 (21 y.o. Female) Treating RN: Phillis HaggisPinkerton, Debi Primary Care Ebelyn Bohnet: Ruthe MannanAron, Talia Other Clinician: Referring Vermon Grays: Glenna FellowsELSTON, SCOTT Treating Taher Vannote/Extender: Altamese CarolinaOBSON, MICHAEL G Weeks in Treatment: 0 Visit Information Patient Arrived: Ambulatory Arrival Time: 08:26 Accompanied By: self Transfer Assistance: None Patient Identification Verified: Yes Secondary Verification Process Completed: Yes Patient Requires Transmission-Based No Precautions: Patient Has Alerts: No Electronic Signature(s) Signed: 04/16/2018 4:43:18 PM By: Alejandro MullingPinkerton, Debra Entered By: Alejandro MullingPinkerton, Debra on 04/16/2018 08:26:59 Glenda Morrison, Glenda Morrison ([id number]) -------------------------------------------------------------------------------- Clinic Level of Care Assessment Details Patient Name: Glenda Morrison, Glenda Morrison Date of Service: 04/16/2018 8:00 AM Medical Record Number: [id number] Patient Account Number: [id number] Date of Birth/Sex: 05/25/1996 (21 y.o. Female) Treating RN: Curtis Sitesorthy, Joanna Primary Care Azzie Thiem: Ruthe MannanAron,  Talia Other Clinician: Referring Heli Dino: Glenna FellowsELSTON, SCOTT Treating Rianna Lukes/Extender: Altamese CarolinaOBSON, MICHAEL G Weeks in Treatment: 0 Clinic Level of Care Assessment Items TOOL 2 Quantity Score []  - Use when only an EandM is performed on the INITIAL visit 0 ASSESSMENTS - Nursing Assessment / Reassessment X - General Physical Exam (combine w/ comprehensive assessment (listed just below) when 1 20 performed on new pt. evals) X- 1 25 Comprehensive Assessment (HX, ROS, Risk Assessments, Wounds Hx, etc.) ASSESSMENTS - Wound and Skin Assessment / Reassessment X - Simple Wound Assessment / Reassessment - one wound 1 5 []  - 0 Complex Wound Assessment / Reassessment - multiple wounds []  - 0 Dermatologic / Skin Assessment (not related to wound area) ASSESSMENTS - Ostomy and/or Continence Assessment and Care []  - Incontinence Assessment and Management 0 []  - 0 Ostomy Care Assessment and Management (repouching, etc.) PROCESS - Coordination of Care X - Simple Patient / Family Education for ongoing care 1 15 []  - 0 Complex (extensive) Patient / Family Education for ongoing care X- 1 10 Staff obtains ChiropractorConsents, Records, Test Results / Process Orders []  - 0 Staff telephones HHA, Nursing Homes / Clarify orders / etc []  - 0 Routine Transfer to another Facility (non-emergent condition) []  - 0 Routine Hospital Admission (non-emergent condition) X- 1 15 New Admissions / Manufacturing engineernsurance Authorizations / Ordering NPWT, Apligraf, etc. []  - 0 Emergency Hospital Admission (emergent condition) X- 1 10 Simple Discharge Coordination []  - 0 Complex (extensive) Discharge Coordination PROCESS - Special Needs []  - Pediatric / Minor Patient Management 0 []  - 0 Isolation Patient Management Kimberlee NearingLAPARRA, Glenda Morrison ([id number]) []  - 0 Hearing / Language / Visual special needs []  - 0 Assessment of Community assistance (transportation, D/C planning, etc.) []  - 0 Additional assistance / Altered mentation []  - 0 Support  Surface(s) Assessment (bed, cushion, seat, etc.) INTERVENTIONS - Wound Cleansing / Measurement X - Wound Imaging (photographs - any number of wounds) 1 5 []  - 0 Wound Tracing (instead of photographs) X- 1 5 Simple Wound Measurement - one wound []  - 0 Complex Wound Measurement - multiple wounds X- 1 5 Simple Wound Cleansing - one wound []  - 0 Complex Wound  Cleansing - multiple wounds INTERVENTIONS - Wound Dressings X - Small Wound Dressing one or multiple wounds 1 10 []  - 0 Medium Wound Dressing one or multiple wounds []  - 0 Large Wound Dressing one or multiple wounds []  - 0 Application of Medications - injection INTERVENTIONS - Miscellaneous []  - External ear exam 0 []  - 0 Specimen Collection (cultures, biopsies, blood, body fluids, etc.) []  - 0 Specimen(s) / Culture(s) sent or taken to Lab for analysis []  - 0 Patient Transfer (multiple staff / Michiel Sites Lift / Similar devices) []  - 0 Simple Staple / Suture removal (25 or less) []  - 0 Complex Staple / Suture removal (26 or more) []  - 0 Hypo / Hyperglycemic Management (close monitor of Blood Glucose) []  - 0 Ankle / Brachial Index (ABI) - do not check if billed separately Has the patient been seen at the hospital within the last three years: Yes Total Score: 125 Level Of Care: New/Established - Level 4 Electronic Signature(s) Signed: 04/16/2018 5:18:29 PM By: Curtis Sites Entered By: Curtis Sites on 04/16/2018 08:54:23 Glenda Morrison ([id number]) -------------------------------------------------------------------------------- Encounter Discharge Information Details Patient Name: Glenda Morrison Date of Service: 04/16/2018 8:00 AM Medical Record Number: [id number] Patient Account Number: [id number] Date of Birth/Sex: 1995/09/02 (21 y.o. Female) Treating RN: Curtis Sites Primary Care Montrelle Eddings: Ruthe Mannan Other Clinician: Referring Najib Colmenares: Glenna Fellows Treating Yaneli Keithley/Extender: Altamese [place] in  Treatment: 0 Encounter Discharge Information Items Discharge Condition: Stable Ambulatory Status: Ambulatory Discharge Destination: Home Transportation: Private Auto Accompanied By: self Schedule Follow-up Appointment: Yes Clinical Summary of Care: Electronic Signature(s) Signed: 04/16/2018 9:06:59 AM By: Curtis Sites Entered By: Curtis Sites on 04/16/2018 09:06:58 Glenda Morrison ([id number]) -------------------------------------------------------------------------------- Lower Extremity Assessment Details Patient Name: Glenda Morrison Date of Service: 04/16/2018 8:00 AM Medical Record Number: [id number] Patient Account Number: [id number] Date of Birth/Sex: 09-22-1995 (21 y.o. Female) Treating RN: Phillis Haggis Primary Care Rigo Letts: Ruthe Mannan Other Clinician: Referring Ali Mohl: Glenna Fellows Treating Sapphira Harjo/Extender: Maxwell Caul Weeks in Treatment: 0 Electronic Signature(s) Signed: 04/16/2018 4:43:18 PM By: Alejandro Mulling Entered By: Alejandro Mulling on 04/16/2018 08:36:30 Glenda Morrison ([id number]) -------------------------------------------------------------------------------- Multi Wound Chart Details Patient Name: Glenda Morrison Date of Service: 04/16/2018 8:00 AM Medical Record Number: [id number] Patient Account Number: [id number] Date of Birth/Sex: Apr 02, 1996 (21 y.o. Female) Treating RN: Curtis Sites Primary Care Gwendalyn Mcgonagle: Ruthe Mannan Other Clinician: Referring Aleksi Brummet: Glenna Fellows Treating Tadeusz Stahl/Extender: Maxwell Caul Weeks in Treatment: 0 Vital Signs Height(in): 69 Pulse(bpm): 56 Weight(lbs): 142.8 Blood Pressure(mmHg): 110/60 Body Mass Index(BMI): 21 Temperature(F): 98.3 Respiratory Rate 18 (breaths/min): Photos: [1:No Photos] [N/A:N/A] Wound Location: [1:Left Upper Leg - Anterior] [N/A:N/A] Wounding Event: [1:Trauma] [N/A:N/A] Primary Etiology: [1:Dehisced Wound] [N/A:N/A] Secondary Etiology: [1:Trauma, Other]  [N/A:N/A] Comorbid History: [1:Asthma, Osteoarthritis] [N/A:N/A] Date Acquired: [1:03/26/2018] [N/A:N/A] Weeks of Treatment: [1:0] [N/A:N/A] Wound Status: [1:Open] [N/A:N/A] Measurements L x W x D [1:1.7x0.7x0.1] [N/A:N/A] (cm) Area (cm) : [1:0.935] [N/A:N/A] Volume (cm) : [1:0.093] [N/A:N/A] Classification: [1:Full Thickness Without Exposed Support Structures] [N/A:N/A] Exudate Amount: [1:Medium] [N/A:N/A] Exudate Type: [1:Serous] [N/A:N/A] Exudate Color: [1:amber] [N/A:N/A] Wound Margin: [1:Distinct, outline attached] [N/A:N/A] Granulation Amount: [1:Medium (34-66%)] [N/A:N/A] Granulation Quality: [1:Red] [N/A:N/A] Necrotic Amount: [1:Medium (34-66%)] [N/A:N/A] Exposed Structures: [1:Fascia: No Fat Layer (Subcutaneous Tissue) Exposed: No Tendon: No Muscle: No Joint: No Bone: No] [N/A:N/A] Epithelialization: [1:None] [N/A:N/A] Periwound Skin Texture: [1:No Abnormalities Noted] [N/A:N/A] Periwound Skin Moisture: [1:No Abnormalities Noted] [N/A:N/A] Periwound Skin Color: [1:No Abnormalities Noted] [N/A:N/A] Temperature: [1:No Abnormality] [N/A:N/A] Tenderness on Palpation: [1:Yes] [N/A:N/A] Wound Preparation: [1:Ulcer Cleansing: Rinsed/Irrigated with Saline] [N/A:N/A] Topical Anesthetic  Applied: Other: lidocaine 4% Treatment Notes Electronic Signature(s) Signed: 04/16/2018 5:35:06 PM By: Baltazar Najjar MD Entered By: Baltazar Najjar on 04/16/2018 08:58:37 Glenda Morrison ([id number]) -------------------------------------------------------------------------------- Multi-Disciplinary Care Plan Details Patient Name: Glenda Morrison Date of Service: 04/16/2018 8:00 AM Medical Record Number: [id number] Patient Account Number: [id number] Date of Birth/Sex: [birth year] (21 y.o. Female) Treating RN: Curtis Sites Primary Care Sangita Zani: Ruthe Mannan Other Clinician: Referring Sophiana Milanese: Glenna Fellows Treating Kess Mcilwain/Extender: Altamese [place] in Treatment: 0 Active  Inactive ` Orientation to the Wound Care Program Nursing Diagnoses: Knowledge deficit related to the wound healing center program Goals: Patient/caregiver will verbalize understanding of the Wound Healing Center Program Date Initiated: 04/16/2018 Target Resolution Date: 06/27/2018 Goal Status: Active Interventions: Provide education on orientation to the wound center Notes: ` Pain, Acute or Chronic Nursing Diagnoses: Potential alteration in comfort, pain Goals: Patient will verbalize adequate pain control and receive pain control interventions during procedures as needed Date Initiated: 04/16/2018 Target Resolution Date: 06/27/2018 Goal Status: Active Interventions: Assess comfort goal upon admission Notes: ` Wound/Skin Impairment Nursing Diagnoses: Impaired tissue integrity Goals: Ulcer/skin breakdown will heal within 14 weeks Date Initiated: 04/16/2018 Target Resolution Date: 06/27/2018 Goal Status: Active Interventions: HAISLEE, CORSO ([id number]) Assess patient/caregiver ability to obtain necessary supplies Assess patient/caregiver ability to perform ulcer/skin care regimen upon admission and as needed Assess ulceration(s) every visit Notes: Electronic Signature(s) Signed: 04/16/2018 5:18:29 PM By: Curtis Sites Entered By: Curtis Sites on 04/16/2018 08:51:48 Glenda Morrison ([id number]) -------------------------------------------------------------------------------- Pain Assessment Details Patient Name: Glenda Morrison Date of Service: 04/16/2018 8:00 AM Medical Record Number: [id number] Patient Account Number: [id number] Date of Birth/Sex: 1996/04/11 (21 y.o. Female) Treating RN: Phillis Haggis Primary Care Dorothymae Maciver: Ruthe Mannan Other Clinician: Referring Bianca Raneri: Glenna Fellows Treating Samanatha Brammer/Extender: Maxwell Caul Weeks in Treatment: 0 Active Problems Location of Pain Severity and Description of Pain Patient Has Paino No Site Locations Pain  Management and Medication Current Pain Management: Electronic Signature(s) Signed: 04/16/2018 4:43:18 PM By: Alejandro Mulling Entered By: Alejandro Mulling on 04/16/2018 08:27:04 Glenda Morrison ([id number]) -------------------------------------------------------------------------------- Patient/Caregiver Education Details Patient Name: Glenda Morrison Date of Service: 04/16/2018 8:00 AM Medical Record Number: [id number] Patient Account Number: [id number] Date of Birth/Gender: 1996-08-04 (21 y.o. Female) Treating RN: Curtis Sites Primary Care Physician: Ruthe Mannan Other Clinician: Referring Physician: Glenna Fellows Treating Physician/Extender: Altamese  in Treatment: 0 Education Assessment Education Provided To: Patient Education Topics Provided Wound/Skin Impairment: Handouts: Other: wound care as ordered Methods: Demonstration, Explain/Verbal Responses: State content correctly Electronic Signature(s) Signed: 04/16/2018 5:18:29 PM By: Curtis Sites Entered By: Curtis Sites on 04/16/2018 09:07:48 Glenda Morrison ([id number]) -------------------------------------------------------------------------------- Wound Assessment Details Patient Name: Glenda Morrison Date of Service: 04/16/2018 8:00 AM Medical Record Number: [id number] Patient Account Number: [id number] Date of Birth/Sex: 09/20/1995 (21 y.o. Female) Treating RN: Ashok Cordia, Debi Primary Care Derian Pfost: Ruthe Mannan Other Clinician: Referring Chantalle Defilippo: Glenna Fellows Treating Kaitlen Redford/Extender: Maxwell Caul Weeks in Treatment: 0 Wound Status Wound Number: 1 Primary Etiology: Dehisced Wound Wound Location: Left Upper Leg - Anterior Secondary Etiology: Trauma, Other Wounding Event: Trauma Wound Status: Open Date Acquired: 03/26/2018 Comorbid History: Asthma, Osteoarthritis Weeks Of Treatment: 0 Clustered Wound: No Photos Photo Uploaded By: Alejandro Mulling on 04/16/2018 16:30:37 Wound  Measurements Length: (cm) 1.7 Width: (cm) 0.7 Depth: (cm) 0.1 Area: (cm) 0.935 Volume: (cm) 0.093 % Reduction in Area: % Reduction in Volume: Epithelialization: None Tunneling: No Undermining: No Wound Description Full Thickness Without Exposed Support Classification: Structures Wound Margin: Distinct, outline attached Exudate Medium Amount: Exudate Type: Serous Exudate Color: amber  Foul Odor After Cleansing: No Slough/Fibrino Yes Wound Bed Granulation Amount: Medium (34-66%) Exposed Structure Granulation Quality: Red Fascia Exposed: No Necrotic Amount: Medium (34-66%) Fat Layer (Subcutaneous Tissue) Exposed: No Necrotic Quality: Adherent Slough Tendon Exposed: No Muscle Exposed: No Joint Exposed: No Bone Exposed: No LAPARRA, Glenda Morrison ([id number]) Periwound Skin Texture Texture Color No Abnormalities Noted: No No Abnormalities Noted: No Moisture Temperature / Pain No Abnormalities Noted: No Temperature: No Abnormality Tenderness on Palpation: Yes Wound Preparation Ulcer Cleansing: Rinsed/Irrigated with Saline Topical Anesthetic Applied: Other: lidocaine 4%, Treatment Notes Wound #1 (Left, Anterior Upper Leg) 1. Cleansed with: Clean wound with Normal Saline 2. Anesthetic Topical Lidocaine 4% cream to wound bed prior to debridement 4. Dressing Applied: Prisma Ag 5. Secondary Dressing Applied Bordered Foam Dressing Electronic Signature(s) Signed: 04/16/2018 4:43:18 PM By: Alejandro Mulling Entered By: Alejandro Mulling on 04/16/2018 08:43:24 Glenda Morrison ([id number]) -------------------------------------------------------------------------------- Vitals Details Patient Name: Glenda Morrison Date of Service: 04/16/2018 8:00 AM Medical Record Number: [id number] Patient Account Number: [id number] Date of Birth/Sex: 1995/10/26 (21 y.o. Female) Treating RN: Phillis Haggis Primary Care Zariyah Stephens: Ruthe Mannan Other Clinician: Referring Kevona Lupinacci: Glenna Fellows Treating Philippe Gang/Extender: Maxwell Caul Weeks in Treatment: 0 Vital Signs Time Taken: 08:27 Temperature (F): 98.3 Height (in): 69 Pulse (bpm): 56 Source: Stated Respiratory Rate (breaths/min): 18 Weight (lbs): 142.8 Blood Pressure (mmHg): 110/60 Source: Measured Reference Range: 80 - 120 mg / dl Body Mass Index (BMI): 21.1 Electronic Signature(s) Signed: 04/16/2018 4:43:18 PM By: Alejandro Mulling Entered By: Alejandro Mulling on 04/16/2018 08:30:39

## 2018-04-25 NOTE — Progress Notes (Signed)
HAILI, DONOFRIO ([id number]) Visit Report for 04/16/2018 Chief Complaint Document Details Patient Name: Glenda Morrison, Glenda Morrison Date of Service: 04/16/2018 8:00 AM Medical Record Number: [id number] Patient Account Number: [id number] Date of Birth/Sex: 05/02/1996 (21 y.o. Female) Treating RN: Huel Coventry Primary Care Provider: Ruthe Mannan Other Clinician: Referring Provider: Glenna Fellows Treating Provider/Extender: Maxwell Caul Weeks in Treatment: 0 Information Obtained from: Patient Chief Complaint 04/16/18; patient is here for review of wound on her left anterior thigh Electronic Signature(s) Signed: 04/16/2018 5:35:06 PM By: Baltazar Najjar MD Entered By: Baltazar Najjar on 04/16/2018 08:59:37 Glenda Morrison ([id number]) -------------------------------------------------------------------------------- HPI Details Patient Name: Glenda Morrison Date of Service: 04/16/2018 8:00 AM Medical Record Number: [id number] Patient Account Number: [id number] Date of Birth/Sex: [birth year] (21 y.o. Female) Treating RN: Huel Coventry Primary Care Provider: Ruthe Mannan Other Clinician: Referring Provider: Glenna Fellows Treating Provider/Extender: Maxwell Caul Weeks in Treatment: 0 History of Present Illness HPI Description: ADMISSION 04/16/18 This is an otherwise healthy 22 year old woman who was working with a Chemical engineer about a month ago and traumatized her left anterior thigh with a female. She was seen at an urgent care who didn't feel that they could stitch this but she was seen a primary care who put stitches in this and gave her prophylactic Keflex. After 3-4 days the stitches were taken out by primary care but the wound dehisced with an hours. She was given a course of doxycycline after this she is initially with antibiotics. She states the wound looks better. She has been washing it with soap and water and applying Neosporin. She is not a diabetic and is otherwise healthy. Electronic  Signature(s) Signed: 04/16/2018 5:35:06 PM By: Baltazar Najjar MD Entered By: Baltazar Najjar on 04/16/2018 09:01:12 Glenda Morrison ([id number]) -------------------------------------------------------------------------------- Physical Exam Details Patient Name: Glenda Morrison Date of Service: 04/16/2018 8:00 AM Medical Record Number: [id number] Patient Account Number: [id number] Date of Birth/Sex: [birth year] (21 y.o. Female) Treating RN: Huel Coventry Primary Care Provider: Ruthe Mannan Other Clinician: Referring Provider: Glenna Fellows Treating Provider/Extender: Maxwell Caul Weeks in Treatment: 0 Constitutional Sitting or standing Blood Pressure is within target range for patient.. Pulse regular and within target range for patient.Marland Kitchen Respirations regular, non-labored and within target range.. Temperature is normal and within the target range for the patient.Marland Kitchen appears in no distress. Respiratory Respiratory effort is easy and symmetric bilaterally. Rate is normal at rest and on room air.. Cardiovascular Femoral arteries without bruits and pulses strong.. Pedal pulses palpable and strong bilaterally.. Lymphatic no palpable lymphadenopathy in the left popliteal or inguinal. Integumentary (Hair, Skin) no primary skin issue is seen. Psychiatric No evidence of depression, anxiety, or agitation. Calm, cooperative, and communicative. Appropriate interactions and affect.. Notes wound exam; the patient has a nice clean oval-shaped wound on the left anterior mid thigh. There is healthy granulation here. Minimal surface loss identified. No debridement is necessary. No surrounding infection soft tissue looks fine. Electronic Signature(s) Signed: 04/16/2018 5:35:06 PM By: Baltazar Najjar MD Entered By: Baltazar Najjar on 04/16/2018 09:02:45 Glenda Morrison ([id number]) -------------------------------------------------------------------------------- Physician Orders Details Patient Name:  Glenda Morrison Date of Service: 04/16/2018 8:00 AM Medical Record Number: [id number] Patient Account Number: [id number] Date of Birth/Sex: 15-Aug-1996 (21 y.o. Female) Treating RN: Curtis Sites Primary Care Provider: Ruthe Mannan Other Clinician: Referring Provider: Glenna Fellows Treating Provider/Extender: Altamese [place] in Treatment: 0 Verbal / Phone Orders: No Diagnosis Coding Wound Cleansing Wound #1 Left,Anterior Upper Leg o May Shower, gently pat wound dry prior to applying new dressing. Anesthetic (add to Medication  List) Wound #1 Left,Anterior Upper Leg o Topical Lidocaine 4% cream applied to wound bed prior to debridement (In Clinic Only). Primary Wound Dressing Wound #1 Left,Anterior Upper Leg o Silver Collagen Secondary Dressing Wound #1 Left,Anterior Upper Leg o Boardered Foam Dressing - or a large bandaid Dressing Change Frequency Wound #1 Left,Anterior Upper Leg o Change dressing every day. Follow-up Appointments Wound #1 Left,Anterior Upper Leg o Return Appointment in 2 weeks. Additional Orders / Instructions Wound #1 Left,Anterior Upper Leg o Vitamin A; Vitamin C, Zinc - please add a multivitamin with 100% vitamin A, vitamin C and zinc o Increase protein intake. o Activity as tolerated Electronic Signature(s) Signed: 04/16/2018 5:18:29 PM By: Curtis Sites Signed: 04/16/2018 5:35:06 PM By: Baltazar Najjar MD Entered By: Curtis Sites on 04/16/2018 08:54:40 Glenda Morrison ([id number]) -------------------------------------------------------------------------------- Problem List Details Patient Name: Glenda Morrison Date of Service: 04/16/2018 8:00 AM Medical Record Number: [id number] Patient Account Number: [id number] Date of Birth/Sex: [birth year] (21 y.o. 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 Female) Treating RN: Huel Coventry Primary Care Provider: Ruthe Mannan Other Clinician: Referring Provider: Glenna Fellows Treating Provider/Extender: Maxwell Caul Weeks in Treatment: 0 Subjective Chief Complaint Information obtained from Patient 04/16/18; patient is here for review of wound on her left anterior thigh History of Present Illness (HPI) ADMISSION 04/16/18 This is an otherwise healthy 22 year old woman who was working with a Chemical engineer about a month ago and traumatized her left anterior thigh with a female. She was seen at an urgent care who didn't feel that they could stitch this but she was seen a primary care who put stitches in this and gave her prophylactic Keflex. After 3-4 days the stitches were taken out by primary care but the wound dehisced with an hours. She was given a course of doxycycline after this she is initially with antibiotics. She states the wound looks better. She has been washing it with soap and water and applying Neosporin. She is not a diabetic and is otherwise healthy. Wound History Patient presents with 1 open wound that has been present for approximately 1 month. Patient has been treating wound  in the following manner: neosporin. Laboratory tests have not been performed in the last month. Patient reportedly has not tested positive for an antibiotic resistant organism. Patient reportedly has not tested positive for osteomyelitis. Patient reportedly has not had testing performed to evaluate circulation in the legs. Patient History Allergies NKDA Family History Cancer - Maternal Grandparents,Paternal Grandparents, Diabetes - Father, Heart Disease - Father, Hypertension - Father, Stroke - Father, No family history of Hereditary Spherocytosis, Kidney Disease, Lung Disease, Seizures, Thyroid Problems, Tuberculosis. Social History Never smoker, Marital Status - Single, Alcohol Use - Rarely, Drug Use - No History, Caffeine Use - Daily. Medical History Respiratory Patient has history of Asthma Musculoskeletal Patient has history of Osteoarthritis Review of Systems (ROS) Constitutional Symptoms (General  Health) The patient has no complaints or symptoms. Eyes Complains or has symptoms of Glasses / Contacts. Glenda Morrison, Glenda Morrison ([id number]) Ear/Nose/Mouth/Throat The patient has no complaints or symptoms. Hematologic/Lymphatic The patient has no complaints or symptoms. Cardiovascular The patient has no complaints or symptoms. Gastrointestinal The patient has no complaints or symptoms. Endocrine The patient has no complaints or symptoms. Genitourinary acid reflux Immunological The patient has no complaints or symptoms. Integumentary (Skin) The patient has no complaints or symptoms. Oncologic The patient has no complaints or symptoms. Psychiatric Complains or has symptoms of Anxiety. Objective Constitutional Sitting or standing Blood Pressure is within target range for patient.. Pulse regular and within target range for patient.Marland Kitchen Respirations regular, non-labored and within target range.. Temperature is normal and within the target range for the patient.Marland Kitchen appears in no  distress. Vitals Time Taken: 8:27 AM, Height: 69 in, Source: Stated, Weight: 142.8 lbs, Source: Measured, BMI: 21.1, Temperature: 98.3 F, Pulse: 56 bpm, Respiratory Rate: 18 breaths/min, Blood Pressure: 110/60 mmHg. Respiratory Respiratory effort is easy and symmetric bilaterally. Rate is normal at rest and on room air.. Cardiovascular Femoral arteries without bruits and pulses strong.. Pedal pulses palpable and strong bilaterally.. Lymphatic no palpable lymphadenopathy in the left popliteal or inguinal. Psychiatric No evidence of depression, anxiety, or agitation. Calm, cooperative, and communicative. Appropriate interactions and affect.. General Notes: wound exam; the patient has a nice clean oval-shaped wound on the left anterior mid thigh. There is healthy granulation here. Minimal surface loss identified. No debridement is necessary. No surrounding infection soft tissue looks fine. Integumentary (Hair, Skin) no primary skin issue is seen. Glenda Morrison, Glenda Morrison ([id number]) Wound #1 status is Open. Original cause of wound was Trauma. The wound is located on the Left,Anterior Upper Leg. The wound measures 1.7cm length x 0.7cm width x 0.1cm depth; 0.935cm^2 area and 0.093cm^3 volume. There is no tunneling or undermining noted. There is a medium amount of serous drainage noted. The wound margin is distinct with the outline attached to the wound base. There is medium (34-66%) red granulation within the wound bed. There is a medium (34-66%) amount of necrotic tissue within the wound bed including Adherent Slough. Periwound temperature was noted as No Abnormality. The periwound has tenderness on palpation. 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 Anesthetic (add to Medication List): Wound #1 Left,Anterior Upper Leg: Topical Lidocaine 4% cream applied to wound bed prior to debridement (In Clinic Only). Primary Wound Dressing: Wound #1 Left,Anterior Upper Leg: Silver Collagen Secondary Dressing: Wound #1 Left,Anterior Upper Leg: Boardered Foam Dressing - or a large bandaid Dressing Change Frequency: Wound #1 Left,Anterior Upper Leg: Change dressing every day. Follow-up Appointments: Wound #1 Left,Anterior Upper Leg: Return Appointment in 2 weeks. Additional Orders / Instructions: Wound #1 Left,Anterior Upper Leg: Vitamin A; Vitamin C, Zinc - please add a multivitamin with 100% vitamin A, vitamin C and zinc Increase protein intake. Activity as tolerated #1 this is a traumatic wound as described. She has had 2 rounds of antibiotics but I see no need for current any biotics or cultures. #2 silver collagen and border foam which she'll use to change daily. #3 follow-up in 2 weeks if necessary. The wound is healthy patient is also young and healthy. This should heal easily Glenda Morrison, Glenda Morrison ([id number]) Electronic Signature(s) Signed: 04/16/2018 5:35:06 PM By: Leanord Hawking,  Casimiro NeedleMichael MD Entered By: Baltazar Najjarobson, Noemi Ishmael on 04/16/2018 09:03:44 Glenda Morrison, Glenda Morrison ([id number]) -------------------------------------------------------------------------------- ROS/PFSH Details Patient Name: Glenda Morrison, Glenda Morrison Date of Service: 04/16/2018 8:00 AM Medical Record Number: [id number] Patient Account Number: [id number] Date of Birth/Sex: December 19, 1995 (21 y.o. Female) Treating RN: Ashok CordiaPinkerton, Debi Primary Care Provider: Ruthe MannanAron, Talia Other Clinician: Referring Provider: Glenna FellowsELSTON, SCOTT Treating Provider/Extender: Maxwell CaulOBSON, Shaquana Buel G Weeks in Treatment: 0 Wound History Do you currently have one or more open woundso Yes How many open wounds do you currently haveo 1 Approximately how long have you had your woundso 1 month How have you been treating your wound(s)  until nowo neosporin Has your wound(s) ever healed and then re-openedo No Have you had any lab work done in the past montho No Have you tested positive for an antibiotic resistant organism (MRSA, VRE)o No Have you tested positive for osteomyelitis (bone infection)o No Have you had any tests for circulation on your legso No Eyes Complaints and Symptoms: Positive for: Glasses / Contacts Psychiatric Complaints and Symptoms: Positive for: Anxiety Constitutional Symptoms (General Health) Complaints and Symptoms: No Complaints or Symptoms Ear/Nose/Mouth/Throat Complaints and Symptoms: No Complaints or Symptoms Hematologic/Lymphatic Complaints and Symptoms: No Complaints or Symptoms Respiratory Medical History: Positive for: Asthma Cardiovascular Complaints and Symptoms: No Complaints or Symptoms Gastrointestinal Glenda Morrison, Glenda Morrison ([id number]) Complaints and Symptoms: No Complaints or Symptoms Endocrine Complaints and Symptoms: No Complaints or Symptoms Genitourinary Complaints and Symptoms: Review of System Notes: acid reflux Immunological Complaints and Symptoms: No Complaints or Symptoms Integumentary (Skin) Complaints and Symptoms: No Complaints or Symptoms Musculoskeletal Medical History: Positive for: Osteoarthritis Oncologic Complaints and Symptoms: No Complaints or Symptoms Immunizations Pneumococcal Vaccine: Received Pneumococcal Vaccination: No Implantable Devices Family and Social History Cancer: Yes - Maternal Grandparents,Paternal Grandparents; Diabetes: Yes - Father; Heart Disease: Yes - Father; Hereditary Spherocytosis: No; Hypertension: Yes - Father; Kidney Disease: No; Lung Disease: No; Seizures: No; Stroke: Yes - Father; Thyroid Problems: No; Tuberculosis: No; Never smoker; Marital Status - Single; Alcohol Use: Rarely; Drug Use: No History; Caffeine Use: Daily; Financial Concerns: No; Food, Clothing or Shelter Needs: No; Support System Lacking:  No; Transportation Concerns: No; Advanced Directives: No; Patient does not want information on Advanced Directives; Do not resuscitate: No; Living Will: No; Medical Power of Attorney: No Electronic Signature(s) Signed: 04/16/2018 4:43:18 PM By: Alejandro MullingPinkerton, Debra Signed: 04/16/2018 5:35:06 PM By: Baltazar Najjarobson, Amarien Carne MD Entered By: Alejandro MullingPinkerton, Debra on 04/16/2018 08:34:59 Glenda Morrison, Glenda Morrison ([id number]) -------------------------------------------------------------------------------- SuperBill Details Patient Name: Glenda Morrison, Glenda Morrison Date of Service: 04/16/2018 Medical Record Number: [id number] Patient Account Number: [id number] Date of Birth/Sex: December 19, 1995 (21 y.o. 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

## 2018-04-25 NOTE — Progress Notes (Signed)
Genia HaroldLAPARRA, Francella ([id number]) Visit Report for 04/16/2018 Abuse/Suicide Risk Screen Details Patient Name: Genia HaroldLAPARRA, Kazzandra Date of Service: 04/16/2018 8:00 AM Medical Record Number: [id number] Patient Account Number: [id number] Date of Birth/Sex: June 08, 1996 (21 y.o. Female) Treating RN: Phillis HaggisPinkerton, Debi Primary Care Corban Kistler: Ruthe MannanAron, Talia Other Clinician: Referring Shandora Koogler: Glenna FellowsELSTON, SCOTT Treating Everson Mott/Extender: Maxwell CaulOBSON, MICHAEL G Weeks in Treatment: 0 Abuse/Suicide Risk Screen Items Answer ABUSE/SUICIDE RISK SCREEN: Has anyone close to you tried to hurt or harm you recentlyo No Do you feel uncomfortable with anyone in your familyo No Has anyone forced you do things that you didnot want to doo No Do you have any thoughts of harming yourselfo No Patient displays signs or symptoms of abuse and/or neglect. No Electronic Signature(s) Signed: 04/16/2018 4:43:18 PM By: Alejandro MullingPinkerton, Debra Entered By: Alejandro MullingPinkerton, Debra on 04/16/2018 08:35:08 Genia HaroldLAPARRA, Latanza ([id number]) -------------------------------------------------------------------------------- Activities of Daily Living Details Patient Name: Genia HaroldLAPARRA, Quanita Date of Service: 04/16/2018 8:00 AM Medical Record Number: [id number] Patient Account Number: [id number] Date of Birth/Sex: June 08, 1996 (21 y.o. Female) Treating RN: Phillis HaggisPinkerton, Debi Primary Care Keyen Marban: Ruthe MannanAron, Talia Other Clinician: Referring Naseer Hearn: Glenna FellowsELSTON, SCOTT Treating Geetika Laborde/Extender: Maxwell CaulOBSON, MICHAEL G Weeks in Treatment: 0 Activities of Daily Living Items Answer Activities of Daily Living (Please select one for each item) Drive Automobile Completely Able Take Medications Completely Able Use Telephone Completely Able Care for Appearance Completely Able Use Toilet Completely Able Bath / Shower Completely Able Dress Self Completely Able Feed Self Completely Able Walk Completely Able Get In / Out Bed Completely Able Housework Completely Able Prepare Meals Completely  Able Handle Money Completely Able Shop for Self Completely Able Electronic Signature(s) Signed: 04/16/2018 4:43:18 PM By: Alejandro MullingPinkerton, Debra Entered By: Alejandro MullingPinkerton, Debra on 04/16/2018 08:35:27 Genia HaroldLAPARRA, Guelda ([id number]) -------------------------------------------------------------------------------- Education Assessment Details Patient Name: Genia HaroldLAPARRA, Keelia Date of Service: 04/16/2018 8:00 AM Medical Record Number: [id number] Patient Account Number: [id number] Date of Birth/Sex: June 08, 1996 (21 y.o. Female) Treating RN: Phillis HaggisPinkerton, Debi Primary Care Jalynn Betzold: Ruthe MannanAron, Talia Other Clinician: Referring AmeLie Hollars: Glenna FellowsELSTON, SCOTT Treating Alando Colleran/Extender: Altamese CarolinaOBSON, MICHAEL G Weeks in Treatment: 0 Primary Learner Assessed: Patient Learning Preferences/Education Level/Primary Language Learning Preference: Explanation, Printed Material Highest Education Level: High School Preferred Language: English Cognitive Barrier Assessment/Beliefs Language Barrier: No Translator Needed: No Memory Deficit: No Emotional Barrier: No Cultural/Religious Beliefs Affecting Medical Care: No Physical Barrier Assessment Impaired Vision: Yes Glasses Impaired Hearing: No Decreased Hand dexterity: No Knowledge/Comprehension Assessment Knowledge Level: Medium Comprehension Level: Medium Ability to understand written Medium instructions: Ability to understand verbal Medium instructions: Motivation Assessment Anxiety Level: Calm Cooperation: Cooperative Education Importance: Acknowledges Need Interest in Health Problems: Asks Questions Perception: Coherent Willingness to Engage in Self- Medium Management Activities: Readiness to Engage in Self- Medium Management Activities: Electronic Signature(s) Signed: 04/16/2018 4:43:18 PM By: Alejandro MullingPinkerton, Debra Entered By: Alejandro MullingPinkerton, Debra on 04/16/2018 08:35:49 Genia HaroldLAPARRA, Claudett  ([id number]) -------------------------------------------------------------------------------- Fall Risk Assessment Details Patient Name: Genia HaroldLAPARRA, Rayvn Date of Service: 04/16/2018 8:00 AM Medical Record Number: [id number] Patient Account Number: [id number] Date of Birth/Sex: June 08, 1996 (21 y.o. Female) Treating RN: Ashok CordiaPinkerton, Debi Primary Care Azayla Polo: Ruthe MannanAron, Talia Other Clinician: Referring Kyngston Pickelsimer: Glenna FellowsELSTON, SCOTT Treating Taiven Greenley/Extender: Altamese CarolinaOBSON, MICHAEL G Weeks in Treatment: 0 Fall Risk Assessment Items Have you had 2 or more falls in the last 12 monthso 0 No Have you had any fall that resulted in injury in the last 12 monthso 0 No FALL RISK ASSESSMENT: History of falling - immediate or within 3 months 0 No Secondary diagnosis 0 No Ambulatory aid None/bed rest/wheelchair/nurse 0 No Crutches/cane/walker 0 No Furniture 0 No IV Access/Saline Lock 0 No  Gait/Training Normal/bed rest/immobile 0 No Weak 0 No Impaired 0 No Mental Status Oriented to own ability 0 No Electronic Signature(s) Signed: 04/16/2018 4:43:18 PM By: Alejandro Mulling Entered By: Alejandro Mulling on 04/16/2018 08:35:54 Genia Harold ([id number]) -------------------------------------------------------------------------------- Foot Assessment Details Patient Name: Genia Harold Date of Service: 04/16/2018 8:00 AM Medical Record Number: [id number] Patient Account Number: [id number] Date of Birth/Sex: 07-19-1996 (21 y.o. Female) Treating RN: Phillis Haggis Primary Care Grettel Rames: Ruthe Mannan Other Clinician: Referring Mala Gibbard: Glenna Fellows Treating Zachory Mangual/Extender: Maxwell Caul Weeks in Treatment: 0 Foot Assessment Items Site Locations + = Sensation present, - = Sensation absent, C = Callus, U = Ulcer R = Redness, W = Warmth, M = Maceration, PU = Pre-ulcerative lesion F = Fissure, S = Swelling, D = Dryness Assessment Right: Left: Other Deformity: No No Prior Foot Ulcer: No No Prior  Amputation: No No Charcot Joint: No No Ambulatory Status: Gait: Electronic Signature(s) Signed: 04/16/2018 4:43:18 PM By: Alejandro Mulling Entered By: Alejandro Mulling on 04/16/2018 08:36:21 Genia Harold ([id number]) -------------------------------------------------------------------------------- Nutrition Risk Assessment Details Patient Name: Genia Harold Date of Service: 04/16/2018 8:00 AM Medical Record Number: [id number] Patient Account Number: [id number] Date of Birth/Sex: [birth year] (21 y.o. Female) Treating RN: Ashok Cordia, Debi Primary Care Jeet Shough: Ruthe Mannan Other Clinician: Referring Jhordyn Hoopingarner: Glenna Fellows Treating Miya Luviano/Extender: Maxwell Caul Weeks in Treatment: 0 Height (in): 69 Weight (lbs): 142.8 Body Mass Index (BMI): 21.1 Nutrition Risk Assessment Items NUTRITION RISK SCREEN: I have an illness or condition that made me change the kind and/or amount of 0 No food I eat I eat fewer than two meals per day 0 No I eat few fruits and vegetables, or milk products 0 No I have three or more drinks of beer, liquor or wine almost every day 0 No I have tooth or mouth problems that make it hard for me to eat 0 No I don't always have enough money to buy the food I need 0 No I eat alone most of the time 0 No I take three or more different prescribed or over-the-counter drugs a day 0 No Without wanting to, I have lost or gained 10 pounds in the last six months 0 No I am not always physically able to shop, cook and/or feed myself 0 No Nutrition Protocols Good Risk Protocol Moderate Risk Protocol Electronic Signature(s) Signed: 04/16/2018 4:43:18 PM By: Alejandro Mulling Entered By: Alejandro Mulling on 04/16/2018 08:36:12

## 2018-04-30 ENCOUNTER — Ambulatory Visit: Payer: 59 | Admitting: Internal Medicine

## 2018-05-06 ENCOUNTER — Other Ambulatory Visit: Payer: Self-pay

## 2018-05-06 DIAGNOSIS — E559 Vitamin D deficiency, unspecified: Secondary | ICD-10-CM

## 2018-05-06 DIAGNOSIS — K219 Gastro-esophageal reflux disease without esophagitis: Secondary | ICD-10-CM

## 2018-05-06 DIAGNOSIS — Z01419 Encounter for gynecological examination (general) (routine) without abnormal findings: Secondary | ICD-10-CM

## 2018-05-06 DIAGNOSIS — Z309 Encounter for contraceptive management, unspecified: Secondary | ICD-10-CM

## 2018-05-06 DIAGNOSIS — Z13220 Encounter for screening for lipoid disorders: Secondary | ICD-10-CM

## 2018-05-14 ENCOUNTER — Ambulatory Visit: Payer: 59 | Admitting: Internal Medicine

## 2018-05-16 ENCOUNTER — Other Ambulatory Visit: Payer: 59

## 2018-05-23 ENCOUNTER — Encounter: Payer: 59 | Admitting: Family Medicine

## 2018-09-29 ENCOUNTER — Ambulatory Visit: Payer: Self-pay | Admitting: *Deleted

## 2018-09-29 NOTE — Telephone Encounter (Signed)
Discussed with Larita Fife, will schedule her an appointment to be seen 10/01/18. Please instruct her to go to ER if any increased facial swelling or difficulty breathing.

## 2018-09-29 NOTE — Telephone Encounter (Signed)
Noted  

## 2018-09-29 NOTE — Telephone Encounter (Signed)
Will forward to [place] to discuss with Eunice Blase and get patient scheduled.  Thanks.

## 2018-09-29 NOTE — Telephone Encounter (Signed)
Pt reports eye swelling X 1 month; comes and goes States "Random; one eye swells one day, the other the next day. Sometimes upper lid, other times lower lid." Swelling can be from mild to moderate. States this AM  lower lip swollen, left side. Took benadryl this am, "Came down."  Denies pain, tenderness, no itching but "Feels like something is in my eye at times, other times does not." Denies fever, no difficulty swallowing, speech clear.  Also reports occasional rash on wrists at times. States often occurs along with "Heartburn." Protocol advises appt within 3 days.  Please advise, schedule appt. [phone]   Reason for Disposition . [1] MILD eyelid swelling (puffiness) AND [2] persists > 3 days  (Exception: suspect mosquito bites)    Now with mild lower lip swelling left side of lip; resolves with benadryl.  Answer Assessment - Initial Assessment Questions 1. ONSET: "When did the swelling start?" (e.g., minutes, hours, days)     1 month ago but worsening 2. LOCATION: "What part of the eyelids is swollen?"     Varies 3. SEVERITY: "How swollen is it?"     Down now with benadryl 4. ITCHING: "Is there any itching?" If so, ask: "How much?"   (Scale 1-10; mild, moderate or severe)     Eye itching, "Feels like something is in it at times." 5. PAIN: "Is the swelling painful to touch?" If so, ask: "How painful is it?"   (Scale 1-10; mild, moderate or severe)     Not painful 6. FEVER: "Do you have a fever?" If so, ask: "What is it, how was it measured, and when did it start?"      no 7. CAUSE: "What do you think is causing the swelling?"    Unsure 8. RECURRENT SYMPTOM: "Have you had eyelid swelling before?" If so, ask: "When was the last time?" "What happened that time?"     Yes, 1 month off and on. 9. OTHER SYMPTOMS: "Do you have any other symptoms?" (e.g., blurred vision, eye discharge, rash, runny nose)     Rash at wrists at times,"Random" 10. PREGNANCY: "Is there any chance you are  pregnant?" "When was your last menstrual period?"       no  Protocols used: EYE - [facility]

## 2018-09-29 NOTE — Telephone Encounter (Signed)
I spoke with pt and tried to schedule for Wed but pt is going out of town and cannot come in until Friday. I did advise her if anything changes while she is out of town she needs to head to the nearest ER- she understood.

## 2018-10-01 ENCOUNTER — Other Ambulatory Visit: Payer: Self-pay | Admitting: Family Medicine

## 2018-10-01 DIAGNOSIS — E559 Vitamin D deficiency, unspecified: Secondary | ICD-10-CM

## 2018-10-01 NOTE — Progress Notes (Signed)
Labs entered for CPE.  

## 2018-10-03 ENCOUNTER — Encounter: Payer: Self-pay | Admitting: Family Medicine

## 2018-10-03 ENCOUNTER — Ambulatory Visit (INDEPENDENT_AMBULATORY_CARE_PROVIDER_SITE_OTHER): Payer: 59 | Admitting: Family Medicine

## 2018-10-03 VITALS — BP 128/68 | HR 73 | Temp 98.4°F | Wt 154.2 lb

## 2018-10-03 DIAGNOSIS — L509 Urticaria, unspecified: Secondary | ICD-10-CM | POA: Diagnosis not present

## 2018-10-03 DIAGNOSIS — E559 Vitamin D deficiency, unspecified: Secondary | ICD-10-CM

## 2018-10-03 DIAGNOSIS — H02849 Edema of unspecified eye, unspecified eyelid: Secondary | ICD-10-CM

## 2018-10-03 DIAGNOSIS — R220 Localized swelling, mass and lump, head: Secondary | ICD-10-CM

## 2018-10-03 NOTE — Progress Notes (Signed)
Subjective:    Patient ID: Glenda Morrison, female    DOB: 11-Jan-1996, 23 y.o.   MRN: [id number]  HPI  This is a 23 yo female patient being seen today for a swollen eye/lip. She states she has had hives 5-6 times this month. She has taken Claritin and  Benadryl for her symptoms. Only the Benadryl has helped with relief but makes her tired, the Claritan took 2 days for eye to be "normal"  Patient provided pictures of active hives and eye swelling which was not apparent on office visit. Past Medical History:  Diagnosis Date  . Frequent headaches   . GERD (gastroesophageal reflux disease)    Past Surgical History:  Procedure Laterality Date  . WISDOM TOOTH EXTRACTION     Family History  Problem Relation Age of Onset  . Arthritis Father   . Stroke Father   . Pancreatic cancer Maternal Uncle   . Stroke Paternal Uncle   . Arthritis Maternal Grandmother   . Esophageal cancer Maternal Grandmother   . Pancreatic cancer Maternal Grandfather   . Alcohol abuse Paternal Grandfather   . Breast cancer Unknown        great grandmother   Social History   Tobacco Use  . Smoking status: Never Smoker  . Smokeless tobacco: Never Used  Substance Use Topics  . Alcohol use: No    Alcohol/week: 0.0 standard drinks  . Drug use: No    Review of Systems    per HPI Objective:   Physical Exam Vitals signs reviewed.  Constitutional:      Appearance: Normal appearance. She is normal weight.  HENT:     Head: Normocephalic and atraumatic.  Eyes:     Conjunctiva/sclera: Conjunctivae normal.  Neck:     Musculoskeletal: Normal range of motion and neck supple.  Cardiovascular:     Rate and Rhythm: Normal rate and regular rhythm.     Heart sounds: Normal heart sounds, S1 normal and S2 normal.  Pulmonary:     Effort: Pulmonary effort is normal.     Breath sounds: Normal breath sounds.  Skin:    General: Skin is warm and dry.     Findings: Erythema and rash present. Rash is macular.         Neurological:     Mental Status: She is alert and oriented to person, place, and time.  Psychiatric:        Attention and Perception: Attention and perception normal.        Mood and Affect: Mood and affect normal.        Speech: Speech normal.        Behavior: Behavior normal. Behavior is cooperative.        Cognition and Memory: Cognition and memory normal.        Judgment: Judgment normal.      BP 128/68 (BP Location: Right Arm, Patient Position: Sitting, Cuff Size: Normal)   Pulse 73   Temp 98.4 F (36.9 C) (Oral)   Wt 154 lb 4 oz (70 kg)   LMP 09/08/2018   SpO2 99%   BMI 24.16 kg/m  BP Readings from Last 3 Encounters:  10/03/18 128/68  03/14/18 110/70  06/05/17 114/66   Wt Readings from Last 3 Encounters:  10/03/18 154 lb 4 oz (70 kg)  03/14/18 146 lb (66.2 kg)  06/05/17 144 lb 1.9 oz (65.4 kg)        Assessment & Plan:   Patient Instructions  You can take over  the counter Xyzol daily for prevention, Benadryl at bedtime, as needed  I have put in a referral to allergy  I will see you again soon for you physical  Vitamin D deficiency - Plan: Vitamin D, 25-hydroxy  Swelling of eyelid, unspecified laterality - Plan: Ambulatory referral to Allergy  Lip swelling - Plan: Ambulatory referral to Allergy  Hives - Plan: Ambulatory referral to Allergy

## 2018-10-03 NOTE — Patient Instructions (Signed)
You can take over the counter Xyzol daily for prevention, Benadryl at bedtime, as needed  I have put in a referral to allergy  I will see you again soon for you physical

## 2018-10-03 NOTE — Progress Notes (Signed)
Subjective:    Patient ID: Glenda Morrison, female    DOB: February 03, 1996, 23 y.o.   MRN: [id number]  HPI This is a 23 yo female who presents today with intermittent eyelid swelling x 1 month. Upper and lower lid as well as both eyes affected. Had on episode of lower lip swelling on left side 4 days ago, resolved with benadryl. No throat swelling, difficulty swallowing or breathing. Swelling occurs nearly daily. Had hives 5-6 times this month. No new exposures, off work now. Had exzema in past, dry skin.  Uses essential oils for GERD with good relief.   Past Medical History:  Diagnosis Date  . Frequent headaches   . GERD (gastroesophageal reflux disease)    Past Surgical History:  Procedure Laterality Date  . WISDOM TOOTH EXTRACTION     Family History  Problem Relation Age of Onset  . Arthritis Father   . Stroke Father   . Pancreatic cancer Maternal Uncle   . Stroke Paternal Uncle   . Arthritis Maternal Grandmother   . Esophageal cancer Maternal Grandmother   . Pancreatic cancer Maternal Grandfather   . Alcohol abuse Paternal Grandfather   . Breast cancer Unknown        great grandmother   Social History   Tobacco Use  . Smoking status: Never Smoker  . Smokeless tobacco: Never Used  Substance Use Topics  . Alcohol use: No    Alcohol/week: 0.0 standard drinks  . Drug use: No      Review of Systems Per HPI    Objective:   Physical Exam Vitals signs reviewed.  Constitutional:      Appearance: Normal appearance.  HENT:     Head: Normocephalic and atraumatic.  Eyes:     Conjunctiva/sclera: Conjunctivae normal.     Comments: No lid swelling.   Neck:     Musculoskeletal: Normal range of motion.  Cardiovascular:     Rate and Rhythm: Normal rate.  Skin:    General: Skin is warm and dry.     Findings: No rash.  Neurological:     Mental Status: She is alert and oriented to person, place, and time.  Psychiatric:        Mood and Affect: Mood normal.      Behavior: Behavior normal.        Thought Content: Thought content normal.        Judgment: Judgment normal.     BP 128/68 (BP Location: Right Arm, Patient Position: Sitting, Cuff Size: Normal)   Pulse 73   Temp 98.4 F (36.9 C) (Oral)   Wt 154 lb 4 oz (70 kg)   LMP 09/08/2018   SpO2 99%   BMI 24.16 kg/m  Wt Readings from Last 3 Encounters:  10/03/18 154 lb 4 oz (70 kg)  03/14/18 146 lb (66.2 kg)  06/05/17 144 lb 1.9 oz (65.4 kg)   She does not have any swelling right now, but shows me multiple pictures of eyelid swelling and hive appearing rash.     Assessment & Plan:  1. Swelling of eyelid, unspecified laterality - Ambulatory referral to Allergy - she was advised to take daily non sedating antihistamine and using diphenhydramine prn breakthrough symptoms  2. Lip swelling - ER precautions reviewed - Ambulatory referral to Allergy  3. Vitamin D deficiency - Vitamin D, 25-hydroxy  4. Hives - Ambulatory referral to Allergy   Olean Reeeborah Darcel Zick, FNP-BC  [facility] [place], [facility]  10/05/2018 6:07  PM

## 2018-10-04 LAB — VITAMIN D 25 HYDROXY (VIT D DEFICIENCY, FRACTURES): VIT D 25 HYDROXY: 35 ng/mL (ref 30–100)

## 2018-10-05 ENCOUNTER — Encounter: Payer: Self-pay | Admitting: Family Medicine

## 2018-10-10 ENCOUNTER — Other Ambulatory Visit: Payer: 59

## 2018-10-10 ENCOUNTER — Encounter: Payer: 59 | Admitting: Family Medicine

## 2018-10-17 ENCOUNTER — Ambulatory Visit (INDEPENDENT_AMBULATORY_CARE_PROVIDER_SITE_OTHER): Payer: 59 | Admitting: Family Medicine

## 2018-10-17 ENCOUNTER — Encounter: Payer: Self-pay | Admitting: Family Medicine

## 2018-10-17 ENCOUNTER — Other Ambulatory Visit (HOSPITAL_COMMUNITY)
Admission: RE | Admit: 2018-10-17 | Discharge: 2018-10-17 | Disposition: A | Discharge status: Home / Self Care | Payer: 59 | Source: Ambulatory Visit | Attending: Family Medicine | Admitting: Family Medicine

## 2018-10-17 VITALS — BP 110/78 | HR 61 | Temp 97.6°F | Ht 66.93 in | Wt 149.4 lb

## 2018-10-17 DIAGNOSIS — Z124 Encounter for screening for malignant neoplasm of cervix: Secondary | ICD-10-CM | POA: Diagnosis not present

## 2018-10-17 DIAGNOSIS — Z23 Encounter for immunization: Secondary | ICD-10-CM | POA: Diagnosis not present

## 2018-10-17 DIAGNOSIS — Z0000 Encounter for general adult medical examination without abnormal findings: Secondary | ICD-10-CM

## 2018-10-17 DIAGNOSIS — N938 Other specified abnormal uterine and vaginal bleeding: Secondary | ICD-10-CM

## 2018-10-17 MED ORDER — NORGESTIMATE-ETH ESTRADIOL 0.25-35 MG-MCG PO TABS: 1.0000 | ORAL_TABLET | Freq: Every day | ORAL | 4 refills | Status: DC

## 2018-10-17 NOTE — Patient Instructions (Signed)
Good to see you today  I have sent in a prescription for oral contraceptives to your pharmacy, start this Sunday. It can take 3-4 months for your periods to become more regular. I hope within 1-2 months your periods will be lighter.   Keeping You Healthy  Get These Tests 1. Blood Pressure- Have your blood pressure checked once a year by your health care provider.  Normal blood pressure is 120/80. 2. Weight- Have your body mass index (BMI) calculated to screen for obesity.  BMI is measure of body fat based on height and weight.  You can also calculate your own BMI at https://www.west-esparza.com/. 3. Cholesterol- Have your cholesterol checked every 5 years starting at age 37 then yearly starting at age 36. 4. Chlamydia, HIV, and other sexually transmitted diseases- Get screened every year until age 43, then within three months of each new sexual provider. 5. Pap Test - Every 1-5 years; discuss with your health care provider. 6. Mammogram- Every 1-2 years starting at age 11--50  Take these medicines  Calcium with Vitamin D-Your body needs 1200 mg of Calcium each day and [phone] IU of Vitamin D daily.  Your body can only absorb 500 mg of Calcium at a time so Calcium must be taken in 2 or 3 divided doses throughout the day.  Multivitamin with folic acid- Once daily if it is possible for you to become pregnant.  Get these Immunizations  Gardasil-Series of three doses; prevents HPV related illness such as genital warts and cervical cancer.  Menactra-Single dose; prevents meningitis.  Tetanus shot- Every 10 years.  Flu shot-Every year.  Take these steps 1. Do not smoke-Your healthcare provider can help you quit.  For tips on how to quit go to www.smokefree.gov or call 1-800 QUITNOW. 2. Be physically active- Exercise 5 days a week for at least 30 minutes.  If you are not already physically active, start slow and gradually work up to 30 minutes of moderate physical activity.  Examples of moderate  activity include walking briskly, dancing, swimming, bicycling, etc. 3. Breast Cancer- A self breast exam every month is important for early detection of breast cancer.  For more information and instruction on self breast exams, ask your healthcare provider or [email or web address]. 4. Eat a healthy diet- Eat a variety of healthy foods such as fruits, vegetables, whole grains, low fat milk, low fat cheeses, yogurt, lean meats, poultry and fish, beans, nuts, tofu, etc.  For more information go to www. Thenutritionsource.org 5. Drink alcohol in moderation- Limit alcohol intake to one drink or less per day. Never drink and drive. 6. Depression- Your emotional health is as important as your physical health.  If you're feeling down or losing interest in things you normally enjoy please talk to your healthcare provider about being screened for depression. 7. Dental visit- Brush and floss your teeth twice daily; visit your dentist twice a year. 8. Eye doctor- Get an eye exam at least every 2 years. 9. Helmet use- Always wear a helmet when riding a bicycle, motorcycle, rollerblading or skateboarding. 10. Safe sex- If you may be exposed to sexually transmitted infections, use a condom. 11. Seat belts- Seat belts can save your live; always wear one. 12. Smoke/Carbon Monoxide detectors- These detectors need to be installed on the appropriate level of your home. Replace batteries at least once a year. 13. Skin cancer- When out in the sun please cover up and use sunscreen 15 SPF or higher. 14. Violence- If anyone is threatening or  hurting you, please tell your healthcare provider.        Dysfunctional Uterine Bleeding Dysfunctional uterine bleeding is abnormal bleeding from the uterus. Dysfunctional uterine bleeding includes:  A menstrual period that comes earlier or later than usual.  A menstrual period that is lighter or heavier than usual, or has large blood clots.  Vaginal  bleeding between menstrual periods.  Skipping one or more menstrual periods.  Vaginal bleeding after sex.  Vaginal bleeding after menopause. Follow these instructions at home: Eating and drinking   Eat well-balanced meals. Include foods that are high in iron, such as liver, meat, shellfish, green leafy vegetables, and eggs.  To prevent or treat constipation, your health care provider may recommend that you: ? Drink enough fluid to keep your urine pale yellow. ? Take over-the-counter or prescription medicines. ? Eat foods that are high in fiber, such as beans, whole grains, and fresh fruits and vegetables. ? Limit foods that are high in fat and processed sugars, such as fried or sweet foods. Medicines  Take over-the-counter and prescription medicines only as told by your health care provider.  Do not change medicines without talking with your health care provider.  Aspirin or medicines that contain aspirin may make the bleeding worse. Do not take those medicines: ? During the week before your menstrual period. ? During your menstrual period.  If you were prescribed iron pills, take them as told by your health care provider. Iron pills help to replace iron that your body loses because of this condition. Activity  If you need to change your sanitary pad or tampon more than one time every 2 hours: ? Lie in bed with your feet raised (elevated). ? Place a cold pack on your lower abdomen. ? Rest as much as possible until the bleeding stops or slows down.  Do not try to lose weight until the bleeding has stopped and your blood iron level is back to normal. General instructions   For two months, write down: ? When your menstrual period starts. ? When your menstrual period ends. ? When any abnormal vaginal bleeding occurs. ? What problems you notice.  Keep all follow up visits as told by your health care provider. This is important. Contact a health care provider if you:  Feel  light-headed or weak.  Have nausea and vomiting.  Cannot eat or drink without vomiting.  Feel dizzy or have diarrhea while you are taking medicines.  Are taking birth control pills or hormones, and you want to change them or stop taking them. Get help right away if:  You develop a fever or chills.  You need to change your sanitary pad or tampon more than one time per hour.  Your vaginal bleeding becomes heavier, or your flow contains clots more often.  You develop pain in your abdomen.  You lose consciousness.  You develop a rash. Summary  Dysfunctional uterine bleeding is abnormal bleeding from the uterus.  It includes menstrual bleeding of abnormal duration, volume, or regularity.  Bleeding after sex and after menopause are also considered dysfunctional uterine bleeding. This information is not intended to replace advice given to you by your health care provider. Make sure you discuss any questions you have with your health care provider. Document Released: 08/10/2000 Document Revised: 01/22/2018 Document Reviewed: 01/22/2018 Elsevier Interactive Patient Education  2019 [employer]  Oral Contraception Information Oral contraceptive pills (OCPs) are medicines taken to prevent pregnancy. OCPs are taken by mouth, and they work by:  Preventing  the ovaries from releasing eggs.  Thickening mucus in the lower part of the uterus (cervix), which prevents sperm from entering the uterus.  Thinning the lining of the uterus (endometrium), which prevents a fertilized egg from attaching to the endometrium. OCPs are highly effective when taken exactly as prescribed. However, OCPs do not prevent STIs (sexually transmitted infections). Safe sex practices, such as using condoms while on an OCP, can help prevent STIs. Before starting OCPs Before you start taking OCPs, you may have a physical exam, blood test, and Pap test. However, you are not required to have a pelvic exam in order to be  prescribed OCPs. Your health care provider will make sure you are a good candidate for oral contraception. OCPs are not a good option for certain women, including women who smoke and are older than 35 years, and women with a medical history of high blood pressure, deep vein thrombosis, pulmonary embolism, stroke, cardiovascular disease, or peripheral vascular disease. Discuss with your health care provider the possible side effects of the OCP you may be prescribed. When you start an OCP, be aware that it can take 2-3 months for your body to adjust to changes in hormone levels. Follow instructions from your health care provider about how to start taking your first cycle of OCPs. Depending on when you start the pill, you may need to use a backup form of birth control, such as condoms, during the first week. Make sure you know what steps to take if you ever forget to take the pill. Types of oral contraception  The most common types of birth control pills contain the hormones estrogen and progestin (synthetic progesterone) or progestin only. The combination pill This type of pill contains estrogen and progestin hormones. Combination pills often come in packs of 21, 28, or 91 pills. For each pack, the last 7 pills may not contain hormones, which means you may stop taking the pills for 7 days. Menstrual bleeding occurs during the week that you do not take the pills or that you take the pills with no hormones in them. The minipill This type of pill contains the progestin hormone only. It comes in packs of 28 pills. All 28 pills contain the hormone. You take the pill every day. It is very important to take the pill at the same time each day. Advantages of oral contraceptive pills  Provides reliable and continuous contraception if taken as instructed.  May treat or decrease symptoms of: ? Menstrual period cramps. ? Irregular menstrual cycle or bleeding. ? Heavy menstrual flow. ? Abnormal uterine  bleeding. ? Acne, depending on the type of pill. ? Polycystic ovarian syndrome. ? Endometriosis. ? Iron deficiency anemia. ? Premenstrual symptoms, including premenstrual dysphoric disorder.  May reduce the risk of endometrial and ovarian cancer.  Can be used as emergency contraception.  Prevents mislocated (ectopic) pregnancies and infections of the fallopian tubes. Things that can make oral contraceptive pills less effective OCPs can be less effective if:  You forget to take the pill at the same time every day. This is especially important when taking the minipill.  You have a stomach or intestinal disease that reduces your body's ability to absorb the pill.  You take OCPs with other medicines that make OCPs less effective, such as antibiotics, certain HIV medicines, and some seizure medicines.  You take expired OCPs.  You forget to restart the pill on day 7, if using the packs of 21 pills. Risks associated with oral contraceptive pills Oral  contraceptive pills can sometimes cause side effects, such as:  Headache.  Depression.  Trouble sleeping.  Nausea and vomiting.  Breast tenderness.  Irregular bleeding or spotting during the first several months.  Bloating or fluid retention.  Increase in blood pressure. Combination pills are also associated with a small increase in the risk of:  Blood clots.  Heart attack.  Stroke. Summary  Oral contraceptive pills are medicines taken by mouth to prevent pregnancy. They are highly effective when taken exactly as prescribed.  The most common types of birth control pills contain the hormones estrogen and progestin (synthetic progesterone) or progestin only.  Before you start taking the pill, you may have a physical exam, blood test, and Pap test. Your health care provider will make sure you are a good candidate for oral contraception.  The combination pill may come in a 21-day pack, a 28-day pack, or a 91-day pack. The  minipill contains the progesterone hormone only and comes in packs of 28 pills.  Oral contraceptive pills can sometimes cause side effects, such as headache, nausea, breast tenderness, or irregular bleeding. This information is not intended to replace advice given to you by your health care provider. Make sure you discuss any questions you have with your health care provider. Document Released: 11/03/2002 Document Revised: 11/06/2016 Document Reviewed: 11/06/2016 Elsevier Interactive Patient Education  2019 [employer].

## 2018-10-17 NOTE — Progress Notes (Addendum)
Subjective:    Patient ID: Glenda Morrison, female    DOB: 09-28-1995, 23 y.o.   MRN: [id number]  HPI This is a 23 yo female who presents today for CPE.   Last CPE- 10/23/16 Pap- never Tdap- 03/12/18 Flu- declines Dental-regular Exercise-  Regular Sex- never sexually active Periods- regular, 3-8 days, can be very heavy, cramps and nausea, a lot of clots, takes Pamprin with good relief.     Past Medical History:  Diagnosis Date  . Frequent headaches   . GERD (gastroesophageal reflux disease)    Past Surgical History:  Procedure Laterality Date  . WISDOM TOOTH EXTRACTION     Family History  Problem Relation Age of Onset  . Arthritis Father   . Stroke Father   . Pancreatic cancer Maternal Uncle   . Stroke Paternal Uncle   . Arthritis Maternal Grandmother   . Esophageal cancer Maternal Grandmother   . Pancreatic cancer Maternal Grandfather   . Alcohol abuse Paternal Grandfather   . Breast cancer Unknown        great grandmother   Social History   Tobacco Use  . Smoking status: Never Smoker  . Smokeless tobacco: Never Used  Substance Use Topics  . Alcohol use: No    Alcohol/week: 0.0 standard drinks  . Drug use: No       Review of Systems  Constitutional: Negative.   HENT: Positive for rhinorrhea (recent cold, getting better).   Eyes: Negative.   Respiratory: Positive for cough (dry, improving).   Cardiovascular: Negative.   Gastrointestinal:       Chronic issues with bloating and diarrhea, uses essential oils with good relief.   Endocrine: Negative.   Genitourinary: Positive for menstrual problem.  Musculoskeletal: Negative.   Skin: Negative.   Allergic/Immunologic: Positive for environmental allergies (Has had fewer episodes of hives, eyelid swelling since taking daily antihistamine. ).  Neurological: Positive for headaches (chroinic, intermittent).  Hematological: Negative.   Psychiatric/Behavioral: The patient is nervous/anxious (worse in winter,  better with exercise, busy work season starting in April).        Objective:   Physical Exam Vitals signs reviewed. Exam conducted with a chaperone present.  Constitutional:      General: She is not in acute distress.    Appearance: Normal appearance. She is normal weight. She is not ill-appearing, toxic-appearing or diaphoretic.  HENT:     Head: Normocephalic and atraumatic.     Right Ear: Tympanic membrane, ear canal and external ear normal.     Left Ear: Tympanic membrane, ear canal and external ear normal.     Nose: Nose normal.     Mouth/Throat:     Mouth: Mucous membranes are moist.     Pharynx: Oropharynx is clear.  Eyes:     Conjunctiva/sclera: Conjunctivae normal.  Neck:     Musculoskeletal: Normal range of motion and neck supple. No neck rigidity or muscular tenderness.  Cardiovascular:     Rate and Rhythm: Normal rate and regular rhythm.     Heart sounds: Normal heart sounds.  Pulmonary:     Effort: Pulmonary effort is normal.     Breath sounds: Normal breath sounds.  Chest:     Breasts:        Right: Normal.        Left: Normal.  Abdominal:     General: Abdomen is flat. Bowel sounds are normal. There is no distension.     Palpations: Abdomen is soft. There is no mass.  Tenderness: There is no abdominal tenderness. There is no guarding or rebound.     Hernia: No hernia is present.  Musculoskeletal: Normal range of motion.     Right lower leg: No edema.     Left lower leg: No edema.  Lymphadenopathy:     Cervical: No cervical adenopathy.     Upper Body:     Right upper body: No supraclavicular, axillary or pectoral adenopathy.     Left upper body: No supraclavicular, axillary or pectoral adenopathy.  Skin:    General: Skin is warm and dry.  Neurological:     Mental Status: She is alert and oriented to person, place, and time.  Psychiatric:        Mood and Affect: Mood normal.        Behavior: Behavior normal.        Thought Content: Thought content  normal.        Judgment: Judgment normal.       BP 110/78 (BP Location: Left Arm, Patient Position: Sitting, Cuff Size: Normal)   Pulse 61   Temp 97.6 F (36.4 C) (Oral)   Ht 5' 6.93" (1.7 m)   Wt 149 lb 6.4 oz (67.8 kg)   SpO2 99%   BMI 23.45 kg/m  Depression screen [facility] 2/9 10/17/2018  Decreased Interest 0  Down, Depressed, Hopeless 0  PHQ - 2 Score 0       Assessment & Plan:  1. Annual physical exam - Discussed and encouraged healthy lifestyle choices- adequate sleep, regular exercise, stress management and healthy food choices.   2. Screening for cervical cancer - Cytology - PAP([facility])  3. DUB (dysfunctional uterine bleeding) - Provided written and verbal information regarding diagnosis and treatment. - discussed starting OCPs to regulate menses - can add otc NSAID prn - norgestimate-ethinyl estradiol (ORTHO-CYCLEN,SPRINTEC,PREVIFEM) 0.25-35 MG-MCG tablet; Take 1 tablet by mouth daily.  Dispense: 3 Package; Refill: 4  4. Need for HPV vaccine - she has not had any vaccines since childhood, discussed vaccination against HPV, she will consider and let me know  Olean Ree, FNP-BC  [facility], [facility]  10/17/2018 11:17 AM

## 2018-10-20 ENCOUNTER — Encounter: Payer: Self-pay | Admitting: Family Medicine

## 2018-10-20 LAB — CYTOLOGY - PAP: Diagnosis: NEGATIVE

## 2018-11-18 DIAGNOSIS — J3089 Other allergic rhinitis: Secondary | ICD-10-CM | POA: Diagnosis not present

## 2018-11-18 DIAGNOSIS — J4520 Mild intermittent asthma, uncomplicated: Secondary | ICD-10-CM | POA: Diagnosis not present

## 2018-11-18 DIAGNOSIS — H1045 Other chronic allergic conjunctivitis: Secondary | ICD-10-CM | POA: Diagnosis not present

## 2018-11-18 DIAGNOSIS — L501 Idiopathic urticaria: Secondary | ICD-10-CM | POA: Diagnosis not present

## 2019-09-07 ENCOUNTER — Ambulatory Visit: Payer: 59 | Attending: Internal Medicine

## 2019-09-07 DIAGNOSIS — Z20822 Contact with and (suspected) exposure to covid-19: Secondary | ICD-10-CM

## 2019-09-08 LAB — NOVEL CORONAVIRUS, NAA: SARS-CoV-2, NAA: NOT DETECTED

## 2019-09-23 ENCOUNTER — Ambulatory Visit: Payer: 59 | Attending: Internal Medicine

## 2019-09-23 DIAGNOSIS — Z20822 Contact with and (suspected) exposure to covid-19: Secondary | ICD-10-CM

## 2019-09-24 LAB — NOVEL CORONAVIRUS, NAA: SARS-CoV-2, NAA: NOT DETECTED

## 2019-10-13 LAB — OB RESULTS CONSOLE GBS: GBS: NEGATIVE

## 2020-03-14 ENCOUNTER — Telehealth: Payer: Self-pay | Admitting: *Deleted

## 2020-03-14 NOTE — Telephone Encounter (Signed)
Appointment 7/23 pt aware

## 2020-03-14 NOTE — Telephone Encounter (Signed)
I have not seen patient in over a year. I am unable to send in prescription for her morning sickness. She can schedule a virtual visit to discuss her symptoms.

## 2020-03-14 NOTE — Telephone Encounter (Signed)
Patient left a voicemail stating that she just found out that she is pregnant and is about 6 weeks. Patient wants to know if her PCP can prescribe her something for her nausea. Tried to call patient back to get more information and mail box was full.

## 2020-03-14 NOTE — Telephone Encounter (Signed)
Patient called back stating that she has a new patient appointment scheduled with an OB/GYN on 03/30/20. Patient wants to know if Glenda Sprang NP can prescribe something for her morning sickness until she can go for her new patient appointment. Field seismologist at [facility]

## 2020-03-17 ENCOUNTER — Encounter (HOSPITAL_COMMUNITY): Payer: Self-pay | Admitting: Obstetrics and Gynecology

## 2020-03-17 ENCOUNTER — Inpatient Hospital Stay (HOSPITAL_COMMUNITY)
Admission: AD | Admit: 2020-03-17 | Discharge: 2020-03-17 | Disposition: A | Discharge status: Home / Self Care | Payer: 59 | Attending: Obstetrics and Gynecology | Admitting: Obstetrics and Gynecology

## 2020-03-17 ENCOUNTER — Other Ambulatory Visit: Payer: Self-pay

## 2020-03-17 ENCOUNTER — Inpatient Hospital Stay (HOSPITAL_COMMUNITY): Payer: 59

## 2020-03-17 DIAGNOSIS — Z6791 Unspecified blood type, Rh negative: Secondary | ICD-10-CM

## 2020-03-17 DIAGNOSIS — O26891 Other specified pregnancy related conditions, first trimester: Secondary | ICD-10-CM | POA: Diagnosis not present

## 2020-03-17 DIAGNOSIS — Z3A01 Less than 8 weeks gestation of pregnancy: Secondary | ICD-10-CM | POA: Diagnosis not present

## 2020-03-17 DIAGNOSIS — R519 Headache, unspecified: Secondary | ICD-10-CM | POA: Insufficient documentation

## 2020-03-17 DIAGNOSIS — O468X1 Other antepartum hemorrhage, first trimester: Secondary | ICD-10-CM | POA: Diagnosis not present

## 2020-03-17 DIAGNOSIS — O3680X0 Pregnancy with inconclusive fetal viability, not applicable or unspecified: Secondary | ICD-10-CM

## 2020-03-17 DIAGNOSIS — O208 Other hemorrhage in early pregnancy: Secondary | ICD-10-CM | POA: Diagnosis present

## 2020-03-17 DIAGNOSIS — O418X10 Other specified disorders of amniotic fluid and membranes, first trimester, not applicable or unspecified: Secondary | ICD-10-CM

## 2020-03-17 DIAGNOSIS — O219 Vomiting of pregnancy, unspecified: Secondary | ICD-10-CM

## 2020-03-17 DIAGNOSIS — R110 Nausea: Secondary | ICD-10-CM | POA: Insufficient documentation

## 2020-03-17 DIAGNOSIS — O26899 Other specified pregnancy related conditions, unspecified trimester: Secondary | ICD-10-CM

## 2020-03-17 LAB — URINALYSIS, ROUTINE W REFLEX MICROSCOPIC
Bilirubin Urine: NEGATIVE
Glucose, UA: NEGATIVE mg/dL
Ketones, ur: NEGATIVE mg/dL
Leukocytes,Ua: NEGATIVE
Nitrite: NEGATIVE
Protein, ur: NEGATIVE mg/dL
Specific Gravity, Urine: 1.005 (ref 1.005–1.030)
pH: 8 (ref 5.0–8.0)

## 2020-03-17 LAB — CBC
HCT: 36 % (ref 36.0–46.0)
Hemoglobin: 11.6 g/dL — ABNORMAL LOW (ref 12.0–15.0)
MCH: 25.6 pg — ABNORMAL LOW (ref 26.0–34.0)
MCHC: 32.2 g/dL (ref 30.0–36.0)
MCV: 79.5 fL — ABNORMAL LOW (ref 80.0–100.0)
Platelets: 234 10*3/uL (ref 150–400)
RBC: 4.53 MIL/uL (ref 3.87–5.11)
RDW: 14.6 % (ref 11.5–15.5)
WBC: 7.7 10*3/uL (ref 4.0–10.5)
nRBC: 0 % (ref 0.0–0.2)

## 2020-03-17 LAB — ABO/RH
ABO/RH(D): O NEG
Antibody Screen: NEGATIVE

## 2020-03-17 LAB — OB RESULTS CONSOLE RUBELLA ANTIBODY, IGM: Rubella: IMMUNE

## 2020-03-17 LAB — WET PREP, GENITAL
Clue Cells Wet Prep HPF POC: NONE SEEN
Sperm: NONE SEEN
Trich, Wet Prep: NONE SEEN
Yeast Wet Prep HPF POC: NONE SEEN

## 2020-03-17 LAB — OB RESULTS CONSOLE HEPATITIS B SURFACE ANTIGEN: Hepatitis B Surface Ag: NEGATIVE

## 2020-03-17 LAB — HCG, QUANTITATIVE, PREGNANCY: hCG, Beta Chain, Quant, S: 117189 m[IU]/mL — ABNORMAL HIGH (ref <5)

## 2020-03-17 LAB — OB RESULTS CONSOLE HIV ANTIBODY (ROUTINE TESTING): HIV: NONREACTIVE

## 2020-03-17 LAB — OB RESULTS CONSOLE GC/CHLAMYDIA: Gonorrhea: NEGATIVE

## 2020-03-17 MED ORDER — METOCLOPRAMIDE HCL 10 MG PO TABS
10.0000 mg | ORAL_TABLET | Freq: Four times a day (QID) | ORAL | 0 refills | Status: DC
Start: 2020-03-17 — End: 2020-11-06

## 2020-03-17 MED ORDER — RHO D IMMUNE GLOBULIN 1500 UNIT/2ML IJ SOSY
300.0000 ug | PREFILLED_SYRINGE | Freq: Once | INTRAMUSCULAR | Status: AC
  Administered 2020-03-17: 300 ug via INTRAMUSCULAR
  Filled 2020-03-17: qty 2

## 2020-03-17 NOTE — Discharge Instructions (Signed)
Morning Sickness  Morning sickness is when a woman feels nauseous during pregnancy. This nauseous feeling may or may not come with vomiting. It often occurs in the morning, but it can be a problem at any time of day. Morning sickness is most common during the first trimester. In some cases, it may continue throughout pregnancy. Although morning sickness is unpleasant, it is usually harmless unless the woman develops severe and continual vomiting (hyperemesis gravidarum), a condition that requires more intense treatment. What are the causes? The exact cause of this condition is not known, but it seems to be related to normal hormonal changes that occur in pregnancy. What increases the risk? You are more likely to develop this condition if:  You experienced nausea or vomiting before your pregnancy.  You had morning sickness during a previous pregnancy.  You are pregnant with more than one baby, such as twins. What are the signs or symptoms? Symptoms of this condition include:  Nausea.  Vomiting. How is this diagnosed? This condition is usually diagnosed based on your signs and symptoms. How is this treated? In many cases, treatment is not needed for this condition. Making some changes to what you eat may help to control symptoms. Your health care provider may also prescribe or recommend:  Vitamin B6 supplements.  Anti-nausea medicines.  Ginger. Follow these instructions at home: Medicines  Take over-the-counter and prescription medicines only as told by your health care provider. Do not use any prescription, over-the-counter, or herbal medicines for morning sickness without first talking with your health care provider.  Taking multivitamins before getting pregnant can prevent or decrease the severity of morning sickness in most women. Eating and drinking  Eat a piece of dry toast or crackers before getting out of bed in the morning.  Eat 5 or 6 small meals a day.  Eat dry and  bland foods, such as rice or a baked potato. Foods that are high in carbohydrates are often helpful.  Avoid greasy, fatty, and spicy foods.  Have someone cook for you if the smell of any food causes nausea and vomiting.  If you feel nauseous after taking prenatal vitamins, take the vitamins at night or with a snack.  Snack on protein foods between meals if you are hungry. Nuts, yogurt, and cheese are good options.  Drink fluids throughout the day.  Try ginger ale made with real ginger, ginger tea made from fresh grated ginger, or ginger candies. General instructions  Do not use any products that contain nicotine or tobacco, such as cigarettes and e-cigarettes. If you need help quitting, ask your health care provider.  Get an air purifier to keep the air in your house free of odors.  Get plenty of fresh air.  Try to avoid odors that trigger your nausea.  Consider trying these methods to help relieve symptoms: ? Wearing an acupressure wristband. These wristbands are often worn for seasickness. ? Acupuncture. Contact a health care provider if:  Your home remedies are not working and you need medicine.  You feel dizzy or light-headed.  You are losing weight. Get help right away if:  You have persistent and uncontrolled nausea and vomiting.  You faint.  You have severe pain in your abdomen. Summary  Morning sickness is when a woman feels nauseous during pregnancy. This nauseous feeling may or may not come with vomiting.  Morning sickness is most common during the first trimester.  It often occurs in the morning, but it can be a problem at  any time of day.  In many cases, treatment is not needed for this condition. Making some changes to what you eat may help to control symptoms. This information is not intended to replace advice given to you by your health care provider. Make sure you discuss any questions you have with your health care provider. Document Revised:  07/26/2017 Document Reviewed: 09/15/2016 Elsevier Patient Education  2020 [employer] Subchorionic Hematoma  A subchorionic hematoma is a gathering of blood between the outer wall of the embryo (chorion) and the inner wall of the womb (uterus). This condition can cause vaginal bleeding. If they cause little or no vaginal bleeding, early small hematomas usually shrink on their own and do not affect your baby or pregnancy. When bleeding starts later in pregnancy, or if the hematoma is larger or occurs in older pregnant women, the condition may be more serious. Larger hematomas may get bigger, which increases the chances of miscarriage. This condition also increases the risk of:  Premature separation of the placenta from the uterus.  Premature (preterm) labor.  Stillbirth. What are the causes? The exact cause of this condition is not known. It occurs when blood is trapped between the placenta and the uterine wall because the placenta has separated from the original site of implantation. What increases the risk? You are more likely to develop this condition if:  You were treated with fertility medicines.  You conceived through in vitro fertilization (IVF). What are the signs or symptoms? Symptoms of this condition include:  Vaginal spotting or bleeding.  Contractions of the uterus. These cause abdominal pain. Sometimes you may have no symptoms and the bleeding may only be seen when ultrasound images are taken (transvaginal ultrasound). How is this diagnosed? This condition is diagnosed based on a physical exam. This includes a pelvic exam. You may also have other tests, including:  Blood tests.  Urine tests.  Ultrasound of the abdomen. How is this treated? Treatment for this condition can vary. Treatment may include:  Watchful waiting. You will be monitored closely for any changes in bleeding. During this stage: ? The hematoma may be reabsorbed by the body. ? The hematoma may  separate the fluid-filled space containing the embryo (gestational sac) from the wall of the womb (endometrium).  Medicines.  Activity restriction. This may be needed until the bleeding stops. Follow these instructions at home:  Stay on bed rest if told to do so by your health care provider.  Do not lift anything that is heavier than 10 lbs. (4.5 kg) or as told by your health care provider.  Do not use any products that contain nicotine or tobacco, such as cigarettes and e-cigarettes. If you need help quitting, ask your health care provider.  Track and write down the number of pads you use each day and how soaked (saturated) they are.  Do not use tampons.  Keep all follow-up visits as told by your health care provider. This is important. Your health care provider may ask you to have follow-up blood tests or ultrasound tests or both. Contact a health care provider if:  You have any vaginal bleeding.  You have a fever. Get help right away if:  You have severe cramps in your stomach, back, abdomen, or pelvis.  You pass large clots or tissue. Save any tissue for your health care provider to look at.  You have more vaginal bleeding, and you faint or become lightheaded or weak. Summary  A subchorionic hematoma is a gathering of  blood between the outer wall of the placenta and the uterus.  This condition can cause vaginal bleeding.  Sometimes you may have no symptoms and the bleeding may only be seen when ultrasound images are taken.  Treatment may include watchful waiting, medicines, or activity restriction. This information is not intended to replace advice given to you by your health care provider. Make sure you discuss any questions you have with your health care provider. Document Revised: 07/26/2017 Document Reviewed: 10/09/2016 Elsevier Patient Education  2020 [employer].

## 2020-03-17 NOTE — MAU Note (Signed)
Presents with c/o spotting today and passed 1 quarter sized blood clot.  Reports "dull" abdominal cramping, menstrual like.  LMP 01/30/2020.  Last intercourse 2 days ago.

## 2020-03-17 NOTE — MAU Provider Note (Signed)
History     CSN: [id number]  Arrival date and time: 03/17/20 1712   First Provider Initiated Contact with Patient 03/17/20 1751      Chief Complaint  Patient presents with  . Spotting   Glenda Morrison is a 24 y.o. G1P0 at 6.5 weeks by Definite LMP who plans to receive care at [facility], but is not an established patient.  She presents today for Spotting.  She states this afternoon she had a "little bit of brown like the 7th day of a period."  She states she later had went to the bathroom and wiped and noticed some red spotting with a blood clot the size of a quarter.  Patient denies cramping and instead reports "some mild discomfort."  Patient denies vaginal discharge.  Patient report sexual activity 2 days ago without pain or discomfort.  Patient reports ongoing constipation, but had some diarrhea recently.     OB History    Gravida  1   Para      Term      Preterm      AB      Living        SAB      TAB      Ectopic      Multiple      Live Births              Past Medical History:  Diagnosis Date  . Frequent headaches   . GERD (gastroesophageal reflux disease)     Past Surgical History:  Procedure Laterality Date  . NO PAST SURGERIES    . UPPER GI ENDOSCOPY    . WISDOM TOOTH EXTRACTION      Family History  Problem Relation Age of Onset  . Healthy Mother   . Arthritis Father   . Stroke Father   . Pancreatic cancer Maternal Uncle   . Stroke Paternal Uncle   . Arthritis Maternal Grandmother   . Esophageal cancer Maternal Grandmother   . Cancer Maternal Grandmother        Stomach  . Pancreatic cancer Maternal Grandfather   . Alcohol abuse Paternal Grandfather   . Breast cancer Other        great grandmother    Social History   Tobacco Use  . Smoking status: Never Smoker  . Smokeless tobacco: Never Used  Vaping Use  . Vaping Use: Never used  Substance Use Topics  . Alcohol use: Not Currently    Alcohol/week: 0.0 standard  drinks    Comment: Socially  . Drug use: No    Allergies: No Known Allergies  Medications Prior to Admission  Medication Sig Dispense Refill Last Dose  . albuterol (PROVENTIL HFA;VENTOLIN HFA) 108 (90 Base) MCG/ACT inhaler Inhale 1-2 puffs into the lungs every 6 (six) hours as needed for wheezing or shortness of breath.   More than a month at Unknown time  . diphenhydrAMINE HCl (BENADRYL ALLERGY PO) Take by mouth daily as needed.     . fluocinonide-emollient (LIDEX-E) 0.05 % cream Apply 1 application topically 2 (two) times daily. 30 g 0   . fluticasone (FLONASE) 50 MCG/ACT nasal spray Place 2 sprays into both nostrils daily. (Patient taking differently: Place 2 sprays into both nostrils daily as needed. ) 16 g 6   . loratadine (CLARITIN) 10 MG tablet Take 10 mg by mouth daily as needed for allergies.     . norgestimate-ethinyl estradiol (ORTHO-CYCLEN,SPRINTEC,PREVIFEM) 0.25-35 MG-MCG tablet Take 1 tablet by mouth daily. 3  Package 4     Review of Systems  Constitutional: Negative for chills and fever.  Respiratory: Negative for cough and shortness of breath.   Gastrointestinal: Positive for abdominal pain ("mild cramping"), constipation, diarrhea, nausea and vomiting (None today).  Genitourinary: Positive for vaginal bleeding. Negative for difficulty urinating, dyspareunia, dysuria, pelvic pain and vaginal discharge.  Musculoskeletal: Positive for back pain.  Neurological: Positive for headaches (4-5/10). Negative for dizziness and light-headedness.   Physical Exam   Blood pressure (!) 110/56, pulse 62, temperature 98.5 F (36.9 C), temperature source Oral, resp. rate 18, height 5\' 8"  (1.727 m), weight 66.4 kg, last menstrual period 01/30/2020, SpO2 100 %.  Physical Exam Exam conducted with a chaperone present.  Constitutional:      Appearance: Normal appearance.  HENT:     Head: Normocephalic and atraumatic.  Eyes:     Conjunctiva/sclera: Conjunctivae normal.  Cardiovascular:      Rate and Rhythm: Normal rate.  Pulmonary:     Effort: Pulmonary effort is normal. No respiratory distress.  Abdominal:     General: Abdomen is flat.     Tenderness: There is abdominal tenderness.  Genitourinary:    Vagina: Vaginal discharge present. No bleeding.     Cervix: No friability, erythema or cervical bleeding.     Adnexa:        Right: No tenderness.         Left: No tenderness.       Comments: Speculum Exam: -Normal External Genitalia: Non tender, no apparent discharge at introitus.  -Vaginal Vault: Pink mucosa with good rugae. Scant amt light brown discharge in vault -wet prep collected -Cervix:Pink, no lesions, cysts, or polyps.  Appears closed. No active bleeding from os-GC/CT collected -Bimanual Exam:  Difficult to assess d/t position. No adnexa tenderness. Skin:    General: Skin is warm and dry.  Neurological:     Mental Status: She is alert.  Psychiatric:        Mood and Affect: Mood normal.        Behavior: Behavior normal.        Thought Content: Thought content normal.     MAU Course  Procedures Results for orders placed or performed during the hospital encounter of 03/17/20 (from the past 24 hour(s))  Urinalysis, Routine w reflex microscopic     Status: Abnormal   Collection Time: 03/17/20  5:31 PM  Result Value Ref Range   Color, Urine STRAW (A) YELLOW   APPearance CLEAR CLEAR   Specific Gravity, Urine 1.005 1.005 - 1.030   pH 8.0 5.0 - 8.0   Glucose, UA NEGATIVE NEGATIVE mg/dL   Hgb urine dipstick LARGE (A) NEGATIVE   Bilirubin Urine NEGATIVE NEGATIVE   Ketones, ur NEGATIVE NEGATIVE mg/dL   Protein, ur NEGATIVE NEGATIVE mg/dL   Nitrite NEGATIVE NEGATIVE   Leukocytes,Ua NEGATIVE NEGATIVE   RBC / HPF 0-5 0 - 5 RBC/hpf   WBC, UA 0-5 0 - 5 WBC/hpf   Bacteria, UA RARE (A) NONE SEEN   Squamous Epithelial / LPF 0-5 0 - 5  CBC     Status: Abnormal   Collection Time: 03/17/20  6:03 PM  Result Value Ref Range   WBC 7.7 4.0 - 10.5 K/uL   RBC 4.53  3.87 - 5.11 MIL/uL   Hemoglobin 11.6 (L) 12.0 - 15.0 g/dL   HCT 03/19/20 36 - 46 %   MCV 79.5 (L) 80.0 - 100.0 fL   MCH 25.6 (L) 26.0 - 34.0 pg   MCHC  32.2 30.0 - 36.0 g/dL   RDW 88.9 16.9 - 45.0 %   Platelets 234 150 - 400 K/uL   nRBC 0.0 0.0 - 0.2 %  ABO/Rh     Status: None   Collection Time: 03/17/20  6:03 PM  Result Value Ref Range   ABO/RH(D)      O NEG Performed at [facility], [address] [place], [place] [address]   Wet prep, genital     Status: Abnormal   Collection Time: 03/17/20  6:09 PM   Specimen: Cervix  Result Value Ref Range   Yeast Wet Prep HPF POC NONE SEEN NONE SEEN   Trich, Wet Prep NONE SEEN NONE SEEN   Clue Cells Wet Prep HPF POC NONE SEEN NONE SEEN   WBC, Wet Prep HPF POC MODERATE (A) NONE SEEN   Sperm NONE SEEN   Rh IG workup (includes ABO/Rh)     Status: None (Preliminary result)   Collection Time: 03/17/20  7:18 PM  Result Value Ref Range   Gestational Age(Wks)      6.5 Performed at [facility], [address], [place], [place] [address]    ABO/RH(D) PENDING   US OB LESS THAN 14 WEEKS WITH OB TRANSVAGINAL  Result Date: 03/17/2020 CLINICAL DATA:  Pregnancy of unknown anatomic location, LMP 01/30/2020 EXAM: OBSTETRIC <14 WK [place] AND TRANSVAGINAL OB US TECHNIQUE: Both transabdominal and transvaginal ultrasound examinations were performed for complete evaluation of the gestation as well as the maternal uterus, adnexal regions, and pelvic cul-de-sac. Transvaginal technique was performed to assess early pregnancy. COMPARISON:  None FINDINGS: Intrauterine gestational sac: Present, single Yolk sac:  Present Embryo:  Present Cardiac Activity: Present Heart Rate: 144 bpm CRL:  11.1 mm   7 w   1 d                  [place] EDC: 11/02/2020 Subchorionic hemorrhage:  Small subchronic hemorrhage Maternal uterus/adnexae: Retroverted uterus. RIGHT ovary normal size and morphology, 3.7 x 2.1 x 1.5 cm. LEFT ovary measures 2.1 x 4.0 x 2.3 cm and contains a small corpus  luteum. Trace free pelvic fluid. No adnexal masses. IMPRESSION: Single live intrauterine gestation at 7 weeks 1 day EGA by crown-rump length. Small subchronic hemorrhage. Electronically Signed   By: Ulyses Southward M.D.   On: 03/17/2020 18:58     MDM Pelvic Exam; Wet Prep and GC/CT Labs: UA, UPT, CBC, hCG, ABO Ultrasound Antiemetic Rhogram Injection Assessment and Plan  25 year old G1P0 at 6.5weeks Vaginal Bleeding Headache Nausea  -Reviewed POC with patient. -Labs ordered and collected.  -Exam performed and findings discussed.  -Patient offered and declines pain medication. -Will give Reglan for Nausea. -Will send for [place] and await results.   Cherre Robins 03/17/2020, 5:52 PM   Reassessment (7:29 PM) SIUP at 7.1 weeks [facility] O Negative  -Lab and [place] results as above. -Provider to bedside to discuss results. -Patient reports mild improvement in nausea.  -Educated on [facility], what to expect including bleeding, risks for miscarriage, and resolution.  -Informed of need for Rhogam.  Brief education given. -Patient without questions or concerns. -Will send script to pharmacy for Reglan. -Nurse to administer rhogram shot and monitor per protocol. -Encouraged to call or return to MAU if symptoms worsen or with the onset of new symptoms. -Discharged to home in stable condition.  Cherre Robins MSN, CNM Advanced Practice Provider, [facility] [employer]

## 2020-03-18 ENCOUNTER — Telehealth: Payer: 59 | Admitting: Family Medicine

## 2020-03-18 LAB — RH IG WORKUP (INCLUDES ABO/RH)
ABO/RH(D): O NEG
Gestational Age(Wks): 6.5
Unit division: 0

## 2020-03-18 LAB — GC/CHLAMYDIA PROBE AMP (~~LOC~~) NOT AT ARMC
Chlamydia: NEGATIVE
Comment: NEGATIVE
Comment: NORMAL
Neisseria Gonorrhea: NEGATIVE

## 2020-04-27 LAB — HEPATITIS C ANTIBODY: HCV Ab: NEGATIVE

## 2020-07-08 ENCOUNTER — Ambulatory Visit
Admission: RE | Admit: 2020-07-08 | Discharge: 2020-07-08 | Disposition: A | Discharge status: Home / Self Care | Payer: 59 | Source: Ambulatory Visit | Attending: Family Medicine | Admitting: Family Medicine

## 2020-07-08 ENCOUNTER — Other Ambulatory Visit: Payer: Self-pay

## 2020-07-08 VITALS — BP 122/78 | HR 99 | Temp 99.0°F | Resp 16 | Ht 68.0 in | Wt 150.0 lb

## 2020-07-08 DIAGNOSIS — R519 Headache, unspecified: Secondary | ICD-10-CM | POA: Insufficient documentation

## 2020-07-08 DIAGNOSIS — J029 Acute pharyngitis, unspecified: Secondary | ICD-10-CM | POA: Diagnosis not present

## 2020-07-08 DIAGNOSIS — R059 Cough, unspecified: Secondary | ICD-10-CM | POA: Diagnosis present

## 2020-07-08 DIAGNOSIS — J209 Acute bronchitis, unspecified: Secondary | ICD-10-CM | POA: Insufficient documentation

## 2020-07-08 DIAGNOSIS — J0140 Acute pansinusitis, unspecified: Secondary | ICD-10-CM | POA: Insufficient documentation

## 2020-07-08 DIAGNOSIS — J3489 Other specified disorders of nose and nasal sinuses: Secondary | ICD-10-CM | POA: Insufficient documentation

## 2020-07-08 LAB — POCT RAPID STREP A (OFFICE): Rapid Strep A Screen: NEGATIVE

## 2020-07-08 MED ORDER — AMOXICILLIN-POT CLAVULANATE 875-125 MG PO TABS: 1.0000 | ORAL_TABLET | Freq: Two times a day (BID) | ORAL | 0 refills | Status: AC

## 2020-07-08 MED ORDER — AMOXICILLIN-POT CLAVULANATE 875-125 MG PO TABS
1.0000 | ORAL_TABLET | Freq: Two times a day (BID) | ORAL | 0 refills | Status: DC
Start: 2020-07-08 — End: 2020-07-08

## 2020-07-08 NOTE — ED Triage Notes (Signed)
Pt reports having nasal congestion that began on Sunday.non productive cough, headache and sore throat. sts it is painful to swallow.  2 neg home covid test.  Pt is [redacted]weeks pregnant.

## 2020-07-08 NOTE — ED Provider Notes (Signed)
[facility]   [id number] 07/08/20 Arrival Time: 0844   CC: COVID symptoms  SUBJECTIVE: History from: patient.  Glenda Morrison is a 24 y.o. female who presents with abrupt onset of nasal congestion, sinus pain, PND, headache, sore throat, and persistent dry cough for the last 6 days. Denies sick exposure to COVID, flu or strep. Denies recent travel. Has negtive history of Covid. Has completed Covid vaccines. Has not taken OTC medications for this. There are no aggravating or alleviating factors. Denies previous symptoms in the past. Denies fever, chills, fatigue, rhinorrhea, SOB, wheezing, chest pain, nausea, changes in bowel or bladder habits.    ROS: As per HPI.  All other pertinent ROS negative.     Past Medical History:  Diagnosis Date  . Frequent headaches   . GERD (gastroesophageal reflux disease)    Past Surgical History:  Procedure Laterality Date  . NO PAST SURGERIES    . UPPER GI ENDOSCOPY    . WISDOM TOOTH EXTRACTION     No Known Allergies No current facility-administered medications on file prior to encounter.   Current Outpatient Medications on File Prior to Encounter  Medication Sig Dispense Refill  . albuterol (PROVENTIL HFA;VENTOLIN HFA) 108 (90 Base) MCG/ACT inhaler Inhale 1-2 puffs into the lungs every 6 (six) hours as needed for wheezing or shortness of breath.    . diphenhydrAMINE HCl (BENADRYL ALLERGY PO) Take by mouth daily as needed.    . fluticasone (FLONASE) 50 MCG/ACT nasal spray Place 2 sprays into both nostrils daily. (Patient taking differently: Place 2 sprays into both nostrils daily as needed. ) 16 g 6  . metoCLOPramide (REGLAN) 10 MG tablet Take 1 tablet (10 mg total) by mouth every 6 (six) hours. 30 tablet 0   Social History   Socioeconomic History  . Marital status: Married    Spouse name: Not on file  . Number of children: Not on file  . Years of education: Not on file  . Highest education level: Not on file  Occupational  History  . Not on file  Tobacco Use  . Smoking status: Never Smoker  . Smokeless tobacco: Never Used  Vaping Use  . Vaping Use: Never used  Substance and Sexual Activity  . Alcohol use: Not Currently    Alcohol/week: 0.0 standard drinks    Comment: Socially  . Drug use: No  . Sexual activity: Never  Other Topics Concern  . 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 Difficulty of Paying Living Expenses: Not on file  Food Insecurity:   . Worried About Programme researcher, broadcasting/film/video in the Last Year: Not on file  . Ran Out of Food in the Last Year: Not on file  Transportation Needs:   . Lack of Transportation (Medical): Not on file  . Lack of Transportation (Non-Medical): Not on file  Physical Activity:   . Days of Exercise per Week: Not on file  . Minutes of Exercise per Session: Not on file  Stress:   . Feeling of Stress : Not on file  Social Connections:   . Frequency of Communication with Friends and Family: Not on file  . Frequency of Social Gatherings with Friends and Family: Not on file  .  Attends Religious Services: Not on file  . Active Member of Clubs or Organizations: Not on file  . Attends Banker Meetings: Not on file  . Marital Status: Not on file  Intimate Partner Violence:   . Fear of Current or Ex-Partner: Not on file  . Emotionally Abused: Not on file  . Physically Abused: Not on file  . Sexually Abused: Not on file   Family History  Problem Relation Age of Onset  . Healthy Mother   . Arthritis Father   . Stroke Father   . Pancreatic cancer Maternal Uncle   . Stroke Paternal Uncle   . Arthritis Maternal Grandmother   . Esophageal cancer Maternal Grandmother   . Cancer Maternal Grandmother        Stomach  . Pancreatic  cancer Maternal Grandfather   . Alcohol abuse Paternal Grandfather   . Breast cancer Other        great grandmother    OBJECTIVE:  Vitals:   07/08/20 0850 07/08/20 0851  BP: 122/78   Pulse: 99   Resp: 16   Temp: 99 F (37.2 C)   TempSrc: Oral   SpO2: 99%   Weight:  150 lb (68 kg)  Height:  5\' 8"  (1.727 m)     General appearance: alert; appears fatigued, but nontoxic; speaking in full sentences and tolerating own secretions HEENT: NCAT; Ears: EACs clear, TMs pearly gray; Eyes: PERRL.  EOM grossly intact. Sinuses: frontal and maxillary sinuses tender; Nose: nares patent without rhinorrhea, Throat: oropharynx erythematous, cobblestoning present, tonsils non erythematous or enlarged, uvula midline  Neck: supple with LAD Lungs: unlabored respirations, symmetrical air entry; cough: absent; no respiratory distress; CTAB Heart: regular rate and rhythm.  Radial pulses 2+ symmetrical bilaterally Skin: warm and dry Psychological: alert and cooperative; normal mood and affect  LABS:  No results found for this or any previous visit (from the past 24 hour(s)).   ASSESSMENT & PLAN:  1. Acute non-recurrent pansinusitis   2. Cough   3. Acute bronchitis, unspecified organism   4. Rhinorrhea   5. Nonintractable headache, unspecified chronicity pattern, unspecified headache type   6. Sore throat     Meds ordered this encounter  Medications  . DISCONTD: amoxicillin-clavulanate (AUGMENTIN) 875-125 MG tablet    Sig: Take 1 tablet by mouth 2 (two) times daily for 10 days.    Dispense:  20 tablet    Refill:  0    Do not fill before 07/10/20    Order Specific Question:   Supervising Provider    Answer:   07/12/20 Merrilee Jansky  . amoxicillin-clavulanate (AUGMENTIN) 875-125 MG tablet    Sig: Take 1 tablet by mouth 2 (two) times daily for 7 days.    Dispense:  14 tablet    Refill:  0    Do not fill before 07/10/20    Order Specific Question:   Supervising Provider    Answer:    07/12/20 [phone]   Your rapid strep test is negative.  A throat culture is pending; we will call you if it is positive requiring treatment.   Prescribed Augmentin for pansinusitis Work note provided COVID and flu testing ordered.  It will take between 1-2 days for test results.  Someone will contact you regarding abnormal results.    Patient should remain in quarantine until they have received Covid results.  If negative you may resume normal activities (go back to work/school) while practicing hand hygiene, social distance, and mask  wearing.  If positive, patient should remain in quarantine for 10 days from symptom onset AND greater than 72 hours after symptoms resolution (absence of fever without the use of fever-reducing medication and improvement in respiratory symptoms), whichever is longer Get plenty of rest and push fluids Use OTC zyrtec for nasal congestion, runny nose, and/or sore throat Use OTC flonase for nasal congestion and runny nose Use medications daily for symptom relief Use OTC medications like ibuprofen or tylenol as needed fever or pain Call or go to the ED if you have any new or worsening symptoms such as fever, worsening cough, shortness of breath, chest tightness, chest pain, turning blue, changes in mental status.  Reviewed expectations re: course of current medical issues. Questions answered. Outlined signs and symptoms indicating need for more acute intervention. Patient verbalized understanding. After Visit Summary given.         Moshe Cipro, NP 07/17/20 1123

## 2020-07-08 NOTE — Discharge Instructions (Addendum)
I have sent in Augmentin for you to take twice a day for 7 days.  Get this filled on Sunday 07/10/20 if symptoms are not improving and viral testing is negative  I have sent in an albuterol inhaler for you to use 2 puffs every 4-6 hours as needed for cough, shortness of breath, wheezing.  Your rapid strep test is negative.  A throat culture is pending; we will call you if it is positive requiring treatment.    Your COVID/Flu/RSV test is pending.  You should self quarantine until the test result is back.    Take Tylenol as needed for fever or discomfort.  Rest and keep yourself hydrated.    Go to the emergency department if you develop acute worsening symptoms.

## 2020-07-10 LAB — COVID-19, FLU A+B AND RSV
Influenza A, NAA: NOT DETECTED
Influenza B, NAA: NOT DETECTED
RSV, NAA: NOT DETECTED
SARS-CoV-2, NAA: NOT DETECTED

## 2020-07-11 LAB — CULTURE, GROUP A STREP (THRC)

## 2020-08-16 LAB — OB RESULTS CONSOLE RPR: RPR: NONREACTIVE

## 2020-08-22 ENCOUNTER — Encounter (HOSPITAL_COMMUNITY): Payer: Self-pay | Admitting: Obstetrics & Gynecology

## 2020-08-22 ENCOUNTER — Observation Stay (HOSPITAL_COMMUNITY)
Admission: AD | Admit: 2020-08-22 | Discharge: 2020-08-23 | Disposition: A | Discharge status: Home / Self Care | Payer: 59 | Attending: Obstetrics & Gynecology | Admitting: Obstetrics & Gynecology

## 2020-08-22 ENCOUNTER — Inpatient Hospital Stay (HOSPITAL_COMMUNITY): Payer: 59

## 2020-08-22 ENCOUNTER — Other Ambulatory Visit: Payer: Self-pay

## 2020-08-22 DIAGNOSIS — N12 Tubulo-interstitial nephritis, not specified as acute or chronic: Secondary | ICD-10-CM

## 2020-08-22 DIAGNOSIS — Z20822 Contact with and (suspected) exposure to covid-19: Secondary | ICD-10-CM | POA: Insufficient documentation

## 2020-08-22 DIAGNOSIS — Z3A29 29 weeks gestation of pregnancy: Secondary | ICD-10-CM | POA: Insufficient documentation

## 2020-08-22 DIAGNOSIS — O2303 Infections of kidney in pregnancy, third trimester: Secondary | ICD-10-CM | POA: Diagnosis not present

## 2020-08-22 DIAGNOSIS — O288 Other abnormal findings on antenatal screening of mother: Secondary | ICD-10-CM | POA: Insufficient documentation

## 2020-08-22 DIAGNOSIS — O26893 Other specified pregnancy related conditions, third trimester: Secondary | ICD-10-CM | POA: Diagnosis present

## 2020-08-22 HISTORY — DX: Tubulo-interstitial nephritis, not specified as acute or chronic: N12

## 2020-08-22 LAB — URINALYSIS, ROUTINE W REFLEX MICROSCOPIC
Bilirubin Urine: NEGATIVE
Glucose, UA: NEGATIVE mg/dL
Ketones, ur: NEGATIVE mg/dL
Nitrite: NEGATIVE
Protein, ur: 100 mg/dL — AB
Specific Gravity, Urine: 1.003 — ABNORMAL LOW (ref 1.005–1.030)
pH: 8 (ref 5.0–8.0)

## 2020-08-22 LAB — CBC
HCT: 31.4 % — ABNORMAL LOW (ref 36.0–46.0)
Hemoglobin: 10.9 g/dL — ABNORMAL LOW (ref 12.0–15.0)
MCH: 30.2 pg (ref 26.0–34.0)
MCHC: 34.7 g/dL (ref 30.0–36.0)
MCV: 87 fL (ref 80.0–100.0)
Platelets: 170 10*3/uL (ref 150–400)
RBC: 3.61 MIL/uL — ABNORMAL LOW (ref 3.87–5.11)
RDW: 13.5 % (ref 11.5–15.5)
WBC: 10.9 10*3/uL — ABNORMAL HIGH (ref 4.0–10.5)
nRBC: 0 % (ref 0.0–0.2)

## 2020-08-22 LAB — COMPREHENSIVE METABOLIC PANEL
ALT: 13 U/L (ref 0–44)
AST: 21 U/L (ref 15–41)
Albumin: 2.9 g/dL — ABNORMAL LOW (ref 3.5–5.0)
Alkaline Phosphatase: 63 U/L (ref 38–126)
Anion gap: 10 (ref 5–15)
BUN: 5 mg/dL — ABNORMAL LOW (ref 6–20)
CO2: 22 mmol/L (ref 22–32)
Calcium: 8.7 mg/dL — ABNORMAL LOW (ref 8.9–10.3)
Chloride: 103 mmol/L (ref 98–111)
Creatinine, Ser: 0.45 mg/dL (ref 0.44–1.00)
GFR, Estimated: >60 mL/min (ref >60)
Glucose, Bld: 84 mg/dL (ref 70–99)
Potassium: 3.5 mmol/L (ref 3.5–5.1)
Sodium: 135 mmol/L (ref 135–145)
Total Bilirubin: 0.6 mg/dL (ref 0.3–1.2)
Total Protein: 6.1 g/dL — ABNORMAL LOW (ref 6.5–8.1)

## 2020-08-22 LAB — RESP PANEL BY RT-PCR (FLU A&B, COVID) ARPGX2
Influenza A by PCR: NEGATIVE
Influenza B by PCR: NEGATIVE
SARS Coronavirus 2 by RT PCR: NEGATIVE

## 2020-08-22 MED ORDER — DOCUSATE SODIUM 100 MG PO CAPS
100.0000 mg | ORAL_CAPSULE | Freq: Every day | ORAL | Status: DC
  Administered 2020-08-23: 11:00:00 100 mg via ORAL
  Filled 2020-08-22: qty 1

## 2020-08-22 MED ORDER — INFLUENZA VAC SPLIT QUAD 0.5 ML IM SUSY: 0.5000 mL | PREFILLED_SYRINGE | INTRAMUSCULAR | Status: DC

## 2020-08-22 MED ORDER — PRENATAL MULTIVITAMIN CH
1.0000 | ORAL_TABLET | Freq: Every day | ORAL | Status: DC
  Administered 2020-08-22 – 2020-08-23 (×2): 1 via ORAL
  Filled 2020-08-22 (×2): qty 1

## 2020-08-22 MED ORDER — SODIUM CHLORIDE 0.9 % IV SOLN
2.0000 g | INTRAVENOUS | Status: DC
  Administered 2020-08-22 – 2020-08-23 (×2): 2 g via INTRAVENOUS
  Filled 2020-08-22: qty 2
  Filled 2020-08-22: qty 20

## 2020-08-22 MED ORDER — ZOLPIDEM TARTRATE 5 MG PO TABS: 5.0000 mg | ORAL_TABLET | Freq: Every evening | ORAL | Status: DC | PRN

## 2020-08-22 MED ORDER — ACETAMINOPHEN 325 MG PO TABS
650.0000 mg | ORAL_TABLET | ORAL | Status: DC | PRN
  Administered 2020-08-22 (×3): 650 mg via ORAL
  Filled 2020-08-22 (×3): qty 2

## 2020-08-22 MED ORDER — CALCIUM CARBONATE ANTACID 500 MG PO CHEW: 2.0000 | CHEWABLE_TABLET | ORAL | Status: DC | PRN

## 2020-08-22 MED ORDER — LACTATED RINGERS IV BOLUS
1000.0000 mL | Freq: Once | INTRAVENOUS | Status: AC
  Administered 2020-08-22: 10:00:00 1000 mL via INTRAVENOUS

## 2020-08-22 MED ORDER — SODIUM CHLORIDE 0.9 % IV BOLUS
1000.0000 mL | Freq: Once | INTRAVENOUS | Status: AC
  Administered 2020-08-22: 11:00:00 1000 mL via INTRAVENOUS

## 2020-08-22 NOTE — MAU Note (Signed)
Pt c/o rt sided flank pain which started this morning. Now feels in front. Has pain, urgency and frequency when urinating. Just completed course of antibiotics for UTI last week.  Has had some abdominal cramps. Denies LOF or VB, +FM.

## 2020-08-22 NOTE — H&P (Addendum)
Madilynn Montante is a 24 y.o. female G1P0 [redacted]w[redacted]d presented to MAU this morning for right flank pain and nausea. Also had emesis last night. Woke up with right sided back pain, now wrapping to the front. Reports frequency and dysuria, recent treatment for UTI. Reported to MAU provider 3 antibiotics, but after further review of EMR and speaking with patient, she was treated with one antibiotic for a cough in early November and then the following regimen noted below. Denies fever at home. Now tolerating PO. She reports no LOF, VB. Normal FM.   Pregnancy c/b: 1. UTI - review of EMR shows urine culture 07/26/2020 with klebsiella pneumoniae, resistant to ampicillin and Macrobid. 07/29/2020 Bactrim x3 days, 12/13 Amoxicillin x7 days  OB History    Gravida  1   Para      Term      Preterm      AB      Living        SAB      IAB      Ectopic      Multiple      Live Births             Past Medical History:  Diagnosis Date  . Frequent headaches   . GERD (gastroesophageal reflux disease)    Past Surgical History:  Procedure Laterality Date  . NO PAST SURGERIES    . UPPER GI ENDOSCOPY    . WISDOM TOOTH EXTRACTION     Family History: family history includes Alcohol abuse in her paternal grandfather; Arthritis in her father and maternal grandmother; Breast cancer in an other family member; Cancer in her maternal grandmother; Esophageal cancer in her maternal grandmother; Healthy in her mother; Pancreatic cancer in her maternal grandfather and maternal uncle; Stroke in her father and paternal uncle. Social History:  reports that she has never smoked. She has never used smokeless tobacco. She reports previous alcohol use. She reports that she does not use drugs.     Maternal Diabetes: No Genetic Screening: Declined Maternal Ultrasounds/Referrals: Normal Fetal Ultrasounds or other Referrals:  None Maternal Substance Abuse:  No Significant Maternal Medications:  None Significant  Maternal Lab Results:  None Other Comments:  None  Review of Systems Per HPI Exam Physical Exam    Blood pressure (!) 110/56, pulse 71, temperature 97.8 F (36.6 C), temperature source Oral, resp. rate 16, height 5\' 8"  (1.727 m), weight 76.6 kg, last menstrual period 01/30/2020, SpO2 97 %. NAD, resting comfortably Mild Right CVA tenderness Gravid abdomen Fetal testing: FHR 140, 10x10 accels, moderate variability. No decels. Toco quiet Prenatal labs: ABO, Rh:  --/--/O NEG (07/22 1918) Antibody: NEG Performed at [facility], [address], [place], [place]  [phone]/22 1803) Rubella: Immune (07/22 0000) RPR: Nonreactive (12/21 0000)  HBsAg: Negative (07/22 0000)  HIV: Non-reactive (07/22 0000)  GBS:     Recent Labs    08/22/20 0914  WBC 10.9*  HGB 10.9*  HCT 31.4*  PLT 170    Recent Labs    08/22/20 0914  NA 135  K 3.5  CL 103  BUN 5*  CREATININE 0.45  GLUCOSE 84  BILITOT 0.6  ALT 13  AST 21  ALKPHOS 63  PROT 6.1*  ALBUMIN 2.9*    Recent Labs    08/22/20 0914  CALCIUM 8.7*   Assessment/Plan: Haruye Martel 24 y.o. G1P0 at [redacted]w[redacted]d here for observation for possible pyelonephritis 1. Possible pyelonephritis, culture positive 11/30, treated 12/3, continued symptoms  so additional course 12/13. Now presented with continued urinary frequency and dysuria, new right flank pain and emesis last night. Currently tolerating PO and afebrile. WBC 10.9. Given Rocephin 2g in MAU. -Considered renal US but given normal WBC and afebrile, low concern for abscess at this time -Waiting urine culture -Plan for suppression remainder of pregnancy  -Possible d/c tomorrow if remains, afebrile, tolerating PO and can switch to oral antibiotic 2. Fetal testing daily NST 3. Vaccines: s/p tdap in pregnancy, offered Flu vaccine inpatient, considering.    Damian Buckles K Taam-Akelman 08/22/2020, 3:53 PM

## 2020-08-22 NOTE — MAU Provider Note (Signed)
History     CSN: [id number]  Arrival date and time: 08/22/20 4696    Event Date/Time   First Provider Initiated Contact with Patient 08/22/20 [phone]         Chief Complaint  Patient presents with  . Flank Pain   HPI This is a 24 yo G1P0 at [redacted]w[redacted]d who presents with right flank pain with nausea and an episode of vomiting last night. She woke up around 3am with the back pain. No palliating factors - does not improve with position changes, heat, or soaking in the bathtub. Movement makes the pain worse. She also admits to frequency and dysuria. She's been treated 3 times in the past month for UTI. The first time was 3 days of bactrim. The other two times were with macrobid for a week. She is not currently on suppression.   OB History    Gravida  1   Para      Term      Preterm      AB      Living        SAB      IAB      Ectopic      Multiple      Live Births              Past Medical History:  Diagnosis Date  . Frequent headaches   . GERD (gastroesophageal reflux disease)     Past Surgical History:  Procedure Laterality Date  . NO PAST SURGERIES    . UPPER GI ENDOSCOPY    . WISDOM TOOTH EXTRACTION      Family History  Problem Relation Age of Onset  . Healthy Mother   . Arthritis Father   . Stroke Father   . Pancreatic cancer Maternal Uncle   . Stroke Paternal Uncle   . Arthritis Maternal Grandmother   . Esophageal cancer Maternal Grandmother   . Cancer Maternal Grandmother        Stomach  . Pancreatic cancer Maternal Grandfather   . Alcohol abuse Paternal Grandfather   . Breast cancer Other        great grandmother    Social History   Tobacco Use  . Smoking status: Never Smoker  . Smokeless tobacco: Never Used  Vaping Use  . Vaping Use: Never used  Substance Use Topics  . Alcohol use: Not Currently    Alcohol/week: 0.0 standard drinks    Comment: Socially  . Drug use: No    Allergies: No Known Allergies  Medications Prior to  Admission  Medication Sig Dispense Refill Last Dose  . nitrofurantoin, macrocrystal-monohydrate, (MACROBID) 100 MG capsule Take 100 mg by mouth 2 (two) times daily.   Past Week at Unknown time  . Prenatal Vit-Fe Fumarate-FA (PRENATAL MULTIVITAMIN) TABS tablet Take 1 tablet by mouth daily at 12 noon.   08/21/2020 at Unknown time  . albuterol (PROVENTIL HFA;VENTOLIN HFA) 108 (90 Base) MCG/ACT inhaler Inhale 1-2 puffs into the lungs every 6 (six) hours as needed for wheezing or shortness of breath.   More than a month at Unknown time  . diphenhydrAMINE HCl (BENADRYL ALLERGY PO) Take by mouth daily as needed.   More than a month at Unknown time  . fluticasone (FLONASE) 50 MCG/ACT nasal spray Place 2 sprays into both nostrils daily. (Patient taking differently: Place 2 sprays into both nostrils daily as needed.) 16 g 6 More than a month at Unknown time  . metoCLOPramide (REGLAN) 10 MG tablet Take  1 tablet (10 mg total) by mouth every 6 (six) hours. 30 tablet 0 Unknown at Unknown time    Review of Systems Physical Exam   Blood pressure 115/62, pulse 69, temperature 97.6 F (36.4 C), temperature source Oral, resp. rate 16, height 5\' 8"  (1.727 m), weight 76.6 kg, last menstrual period 01/30/2020, SpO2 100 %.  Physical Exam Vitals reviewed.  Constitutional:      Appearance: Normal appearance.  Cardiovascular:     Rate and Rhythm: Regular rhythm. Tachycardia present.     Pulses: Normal pulses.     Heart sounds: Normal heart sounds.  Pulmonary:     Effort: Pulmonary effort is normal.     Breath sounds: Normal breath sounds.  Abdominal:     General: Abdomen is flat. There is no distension.     Palpations: Abdomen is soft. There is no mass.     Tenderness: There is abdominal tenderness in the right upper quadrant. There is right CVA tenderness. There is no left CVA tenderness, guarding or rebound. Negative signs include Murphy's sign.     Hernia: No hernia is present.  Skin:    General: Skin is  warm and dry.     Capillary Refill: Capillary refill takes less than 2 seconds.  Neurological:     General: No focal deficit present.     Mental Status: She is alert.  Psychiatric:        Mood and Affect: Mood normal.        Behavior: Behavior normal.        Thought Content: Thought content normal.        Judgment: Judgment normal.    Results for orders placed or performed during the hospital encounter of 08/22/20 (from the past 24 hour(s))  Urinalysis, Routine w reflex microscopic Urine, Clean Catch     Status: Abnormal   Collection Time: 08/22/20  8:52 AM  Result Value Ref Range   Color, Urine YELLOW YELLOW   APPearance HAZY (A) CLEAR   Specific Gravity, Urine 1.003 (L) 1.005 - 1.030   pH 8.0 5.0 - 8.0   Glucose, UA NEGATIVE NEGATIVE mg/dL   Hgb urine dipstick MODERATE (A) NEGATIVE   Bilirubin Urine NEGATIVE NEGATIVE   Ketones, ur NEGATIVE NEGATIVE mg/dL   Protein, ur 08/24/20 (A) NEGATIVE mg/dL   Nitrite NEGATIVE NEGATIVE   Leukocytes,Ua LARGE (A) NEGATIVE   RBC / HPF 0-5 0 - 5 RBC/hpf   WBC, UA 21-50 0 - 5 WBC/hpf   Bacteria, UA RARE (A) NONE SEEN   Squamous Epithelial / LPF 0-5 0 - 5   WBC Clumps PRESENT    Mucus PRESENT   CBC     Status: Abnormal   Collection Time: 08/22/20  9:14 AM  Result Value Ref Range   WBC 10.9 (H) 4.0 - 10.5 K/uL   RBC 3.61 (L) 3.87 - 5.11 MIL/uL   Hemoglobin 10.9 (L) 12.0 - 15.0 g/dL   HCT 08/24/20 (L) 38.1 - 01.7 %   MCV 87.0 80.0 - 100.0 fL   MCH 30.2 26.0 - 34.0 pg   MCHC 34.7 30.0 - 36.0 g/dL   RDW 51.0 25.8 - 52.7 %   Platelets 170 150 - 400 K/uL   nRBC 0.0 0.0 - 0.2 %  Comprehensive metabolic panel     Status: Abnormal   Collection Time: 08/22/20  9:14 AM  Result Value Ref Range   Sodium 135 135 - 145 mmol/L   Potassium 3.5 3.5 - 5.1 mmol/L  Chloride 103 98 - 111 mmol/L   CO2 22 22 - 32 mmol/L   Glucose, Bld 84 70 - 99 mg/dL   BUN 5 (L) 6 - 20 mg/dL   Creatinine, Ser 5.02 0.44 - 1.00 mg/dL   Calcium 8.7 (L) 8.9 - 10.3 mg/dL   Total  Protein 6.1 (L) 6.5 - 8.1 g/dL   Albumin 2.9 (L) 3.5 - 5.0 g/dL   AST 21 15 - 41 U/L   ALT 13 0 - 44 U/L   Alkaline Phosphatase 63 38 - 126 U/L   Total Bilirubin 0.6 0.3 - 1.2 mg/dL   GFR, Estimated >77 >41 mL/min   Anion gap 10 5 - 15  Resp Panel by RT-PCR (Flu A&B, Covid) Nasopharyngeal Swab     Status: None   Collection Time: 08/22/20  9:37 AM   Specimen: Nasopharyngeal Swab; Nasopharyngeal(NP) swabs in vial transport medium  Result Value Ref Range   SARS Coronavirus 2 by RT PCR NEGATIVE NEGATIVE   Influenza A by PCR NEGATIVE NEGATIVE   Influenza B by PCR NEGATIVE NEGATIVE     MAU Course  Procedures NST reactive: baseline 140s, mod variability, +accel, no decel  MDM Appears to have pyelonephritis. Cr normal.  Assessment and Plan     ICD-10-CM   1. Pyelonephritis affecting pregnancy in third trimester  O23.03   2. [redacted] weeks gestation of pregnancy  Z3A.29   3. Non-reactive NST (non-stress test)  O28.8    Discussed patient history, labs, diagnosis with Dr Claudine Mouton, who will admit the patient.   With this being patient's 3rd UTI and pyelonephritis during pregnancy, the patient should remain on suppressive therapy for the remainder of the pregnancy.  Levie Heritage 08/22/2020, 9:07 AM

## 2020-08-23 DIAGNOSIS — O2303 Infections of kidney in pregnancy, third trimester: Secondary | ICD-10-CM | POA: Diagnosis not present

## 2020-08-23 MED ORDER — SULFAMETHOXAZOLE-TRIMETHOPRIM 800-160 MG PO TABS: 1.0000 | ORAL_TABLET | Freq: Two times a day (BID) | ORAL | 0 refills | Status: AC

## 2020-08-23 NOTE — Progress Notes (Signed)
24 y.o. G1P0 [redacted]w[redacted]d HD#0 admitted for Pyelonephritis [N12].  Patient seen and examined, history reviewed.  Is feeling well this AM.  Reports right sided back pain much improved.  Urinary frequency and dysuria have resolved.  Endorses mild intermittent cramping, no contraction, no vaginal bleeding.  Had an episode of emesis 1 day prior to admission, none since.  Tolerating a regular diet.  Fetal movement is normal  Preliminary urine culture is positive for 50,000 colonies / mL of gram negative rods.  Further ID / susceptibilities still pending.  Had a urine culture in the office on 11/30 that was positive for klebsiella pneumoniae.    Vitals:   08/22/20 1545 08/22/20 1924 08/23/20 0517 08/23/20 0748  BP:  (!) 109/53 105/63 (!) 106/52  Pulse:  76 63 71  Resp:  16 15 16   Temp:  97.8 F (36.6 C) 97.9 F (36.6 C) 98.2 F (36.8 C)  TempSrc:  Oral Oral Oral  SpO2: 96% 98% 99% 99%  Weight:      Height:       NAD, well appearing Abdomen: soft, gravid, non-tender Back:  No CVA tenderness Extremities:  No edema   FHTs  130s, moderate variability, + accelerations, no decelerations  Toco  quiet    A/P:  HD#0  [redacted]w[redacted]d with suspected pyelonephritis Clinically well appearing today with significant improvement in symptoms.  Has been afebrile since admission without maternal or fetal tachycardia.  Back pain and urinary symptoms are improving.  Final urine culture is still pending, though suspect will again be klebsiella based on prior urine culture. Receiving second dose of rocephin this morning. Discussed options of discharge home today with choice of PO antibiotics based on urine culture from office on 11/30 vs continued inpatient management awaiting final culture.  As she is clinically improving with the rocephin, feel it is reasonable for discharge today.  Patient agrees.   Will complete course of treatment with bactrim to start 24 hours of rocephin.  Will plan antibiotic prophylaxis the remainder  of pregnancy (pending sensitivities, but likely keflex based on last urine culture profile). Patient agrees with plan   [facility]

## 2020-08-23 NOTE — Progress Notes (Signed)
Reviewed discharge antepartum instructions with patient regarding medications, when to call MD/go to MAU, kick counts, signs and symptoms of labor, and to follow up with OB as scheduled. Patient verbalized understanding of antepartum discharge instructions and asked appropriate questions. Patient left ambulatory with significant other.

## 2020-08-23 NOTE — Discharge Summary (Signed)
Postpartum Discharge Summary  Date of Service updated12/28/21     Patient Name: Glenda Morrison DOB: 1996/01/02 MRN: [id number]  Date of admission: 08/22/2020 Delivery date:This patient has no babies on file. Delivering provider: This patient has no babies on file. Date of discharge: 08/23/2020  Admitting diagnosis: Pyelonephritis [N12] Intrauterine pregnancy: [redacted]w[redacted]d     Secondary diagnosis:  Active Problems:   Pyelonephritis  Additional problems: None    Discharge diagnosis: Pyelonephritis                                                 Hospital course: 24 y.o. female G1P0 [redacted]w[redacted]d presented to MAU with right flank pain and nausea. Woke up with right sided back pain, now wrapping to the front. Reports frequency and dysuria, recent treatment for UTI. Reported to MAU provider 3 antibiotics, but after further review of EMR and speaking with patient, she was treated with one antibiotic for a cough in early November and then for a UTI on 11/30.  Was initially prescribed macrobid, but urine culture returned klebsiella pneumonia, resistant to macrobid.  She was switched to bactrim.  Symptoms rapidly improved, but returned upon completion of the antibiotic course.    She was admitted to antepartum and started on rocephin.  She had a mild wbc of 10.7.  She was never febrile or tachycardic.  Her back pain and urinary symptoms were improved by 24 hours.  Urine culture was still pending, but positive for gram negative rods.  She was offered continued observation or discharge home and she elected for discharge.  Fetal status remained reassuring throughout. She was transitioned to bactrim DS BID x 10 days based on prior urine culture results, but will plan close follow up of the preliminary urine culture drawn on admission.  Will plan antibiotic prophylaxis for the remainder of pregnancy.  Physical exam  Vitals:   08/22/20 1545 08/22/20 1924 08/23/20 0517 08/23/20 0748  BP:  (!) 109/53 105/63 (!)  106/52  Pulse:  76 63 71  Resp:  16 15 16   Temp:  97.8 F (36.6 C) 97.9 F (36.6 C) 98.2 F (36.8 C)  TempSrc:  Oral Oral Oral  SpO2: 96% 98% 99% 99%  Weight:      Height:       General: alert, cooperative and no distress Uterine Fundus: soft  DVT Evaluation: No evidence of DVT seen on physical exam. Labs: Lab Results  Component Value Date   WBC 10.9 (H) 08/22/2020   HGB 10.9 (L) 08/22/2020   HCT 31.4 (L) 08/22/2020   MCV 87.0 08/22/2020   PLT 170 08/22/2020   CMP Latest Ref Rng & Units 08/22/2020  Glucose 70 - 99 mg/dL 84  BUN 6 - 20 mg/dL 5(L)  Creatinine 08/24/2020 - 1.00 mg/dL 4.58  Sodium 0.99 - 833 mmol/L 135  Potassium 3.5 - 5.1 mmol/L 3.5  Chloride 98 - 111 mmol/L 103  CO2 22 - 32 mmol/L 22  Calcium 8.9 - 10.3 mg/dL 825)  Total Protein 6.5 - 8.1 g/dL 6.1(L)  Total Bilirubin 0.3 - 1.2 mg/dL 0.6  Alkaline Phos 38 - 126 U/L 63  AST 15 - 41 U/L 21  ALT 0 - 44 U/L 13   Edinburgh Score: No flowsheet data found.    After visit meds:  Allergies as of 08/23/2020   No Known Allergies  Medication List    STOP taking these medications   nitrofurantoin (macrocrystal-monohydrate) 100 MG capsule Commonly known as: MACROBID     TAKE these medications   albuterol 108 (90 Base) MCG/ACT inhaler Commonly known as: VENTOLIN HFA Inhale 1-2 puffs into the lungs every 6 (six) hours as needed for wheezing or shortness of breath.   BENADRYL ALLERGY PO Take by mouth daily as needed.   fluticasone 50 MCG/ACT nasal spray Commonly known as: FLONASE Place 2 sprays into both nostrils daily. What changed:   when to take this  reasons to take this   metoCLOPramide 10 MG tablet Commonly known as: REGLAN Take 1 tablet (10 mg total) by mouth every 6 (six) hours.   prenatal multivitamin Tabs tablet Take 1 tablet by mouth daily at 12 noon.   sulfamethoxazole-trimethoprim 800-160 MG tablet Commonly known as: BACTRIM DS Take 1 tablet by mouth 2 (two) times daily for 10  days.        Discharge home in stable condition  Future Appointments:No future appointments. Follow up Visit:  Follow-up Information    Ob/Gyn, Nestor Ramp Follow up on 09/06/2020.   Why: Keep scheduled appointment Contact information: [address] [place] [address] [phone]                   08/23/2020 [facility] GEFFEL Chestine Spore, MD

## 2020-08-24 LAB — URINE CULTURE: Culture: 50000 — AB

## 2020-08-27 NOTE — L&D Delivery Note (Signed)
Delivery Note Patient pushed well for 30 minutes.  At 3:52 PM a viable female was delivered via Vaginal, Spontaneous (Presentation: Middle Occiput Anterior).  APGAR: 8, 9; weight 2985 gm (6lb 9.3oz)  .   Placenta status: Spontaneous;Expressed, Intact.  Cord: 3 vessels with the following complications: None.  Cord pH: n/a  Following delivery of the placenta, there was a very long trailing membrane.  The uterus was explored and boggy.  She was given a dose of methergine 0.2mg  IM.  Two additional pieces of membrane were removed.  The uterus was explored again and clear of membranes.  Tone was excellent.    The perineum was in tact, but there were bilateral sulcal lacerations.  The left sulcal laceration extended up to the tip of the left labia.   Following repair, the uterus was again explored and clear of all placenta / membranes with excellent uterine tone.  The large majority of the blood loss is attributed to the vaginal lacerations.    Anesthesia: Epidural Episiotomy: None Lacerations: Labial;Sulcus Suture Repair: 2.0 3.0 vicryl rapide Est. Blood Loss (mL): 836  Mom to postpartum.  Baby to Couplet care / Skin to Skin.  [name] 11/06/2020, 5:00 PM

## 2020-10-12 LAB — OB RESULTS CONSOLE GBS: GBS: NEGATIVE

## 2020-11-03 ENCOUNTER — Other Ambulatory Visit: Payer: Self-pay

## 2020-11-03 ENCOUNTER — Encounter (HOSPITAL_COMMUNITY): Payer: Self-pay | Admitting: Obstetrics and Gynecology

## 2020-11-03 ENCOUNTER — Inpatient Hospital Stay (HOSPITAL_COMMUNITY)
Admission: AD | Admit: 2020-11-03 | Discharge: 2020-11-03 | Disposition: A | Discharge status: Home / Self Care | Payer: 59 | Source: Home / Self Care | Attending: Obstetrics and Gynecology | Admitting: Obstetrics and Gynecology

## 2020-11-03 DIAGNOSIS — Z3A39 39 weeks gestation of pregnancy: Secondary | ICD-10-CM | POA: Insufficient documentation

## 2020-11-03 DIAGNOSIS — O26893 Other specified pregnancy related conditions, third trimester: Secondary | ICD-10-CM | POA: Insufficient documentation

## 2020-11-03 DIAGNOSIS — Z0371 Encounter for suspected problem with amniotic cavity and membrane ruled out: Secondary | ICD-10-CM

## 2020-11-03 LAB — POCT FERN TEST: POCT Fern Test: NEGATIVE

## 2020-11-03 NOTE — MAU Note (Signed)
...  Glenda Morrison is a 25 y.o. at [redacted]w[redacted]d here in MAU reporting: discharge/leaking of fluids for two weeks now but reports it increased around 0900 today and has been continual. Clear/pink tinged fluids. No odor. +FM. No VB. Endorses occasional stomach tightening.  GBS- Pain score: no pain

## 2020-11-03 NOTE — MAU Provider Note (Signed)
S: Glenda Morrison is a 25 y.o. G1P0 at [redacted]w[redacted]d  who presents to MAU today complaining of leaking of fluid since 47am. Sex last night at 9 pm.  She denies vaginal bleeding. She denies contractions. She reports normal fetal movement.    O: BP 112/75 (BP Location: Right Arm)   Pulse 61   Temp 98.4 F (36.9 C) (Oral)   Resp 15   Wt 82 kg   LMP 01/30/2020 (Exact Date)   SpO2 100%   BMI 27.48 kg/m  GENERAL: Well-developed, well-nourished female in no acute distress.  HEAD: Normocephalic, atraumatic.  CHEST: Normal effort of breathing, regular heart rate ABDOMEN: Soft, nontender, gravid PELVIC: Normal external female genitalia. Vagina is pink and rugated. Cervix with normal contour, no lesions. Normal discharge. Negative pooling.   Cervical exam:  Dilation: 1 Effacement (%): 60 Station: -3 Presentation: Vertex Exam by:: Venia Carbon, NP   Fetal Monitoring: Baseline: 125 bpm Variability: moderate  Accelerations: 15x15 Decelerations: None Contractions: occasional   Results for orders placed or performed during the hospital encounter of 11/03/20 (from the past 24 hour(s))  POCT fern test     Status: None   Collection Time: 11/03/20  1:55 PM  Result Value Ref Range   POCT Fern Test Negative = intact amniotic membranes      A: [facility]UP at [redacted]w[redacted]d  Membranes intact  P: Report given to RN to contact MD on call for further instructions Crist Fat negative   Venia Carbon I, NP 11/03/2020 3:08 PM

## 2020-11-05 ENCOUNTER — Inpatient Hospital Stay (EMERGENCY_DEPARTMENT_HOSPITAL)
Admission: AD | Admit: 2020-11-05 | Discharge: 2020-11-05 | Disposition: A | Discharge status: Home / Self Care | Payer: 59 | Source: Home / Self Care | Attending: Obstetrics | Admitting: Obstetrics

## 2020-11-05 ENCOUNTER — Inpatient Hospital Stay (HOSPITAL_COMMUNITY): Admit: 2020-11-05 | Payer: Self-pay

## 2020-11-05 ENCOUNTER — Other Ambulatory Visit: Payer: Self-pay

## 2020-11-05 ENCOUNTER — Encounter (HOSPITAL_COMMUNITY): Payer: Self-pay | Admitting: Obstetrics

## 2020-11-05 DIAGNOSIS — O471 False labor at or after 37 completed weeks of gestation: Secondary | ICD-10-CM | POA: Diagnosis not present

## 2020-11-05 DIAGNOSIS — O479 False labor, unspecified: Secondary | ICD-10-CM

## 2020-11-05 DIAGNOSIS — Z3A40 40 weeks gestation of pregnancy: Secondary | ICD-10-CM

## 2020-11-05 DIAGNOSIS — O480 Post-term pregnancy: Secondary | ICD-10-CM | POA: Insufficient documentation

## 2020-11-05 NOTE — MAU Note (Signed)
Ctx since midnight. Now 3-6 min apart. Reports some bloody show. Good fetal movement reported. 1cm on Friday. Had membranes swept

## 2020-11-05 NOTE — Discharge Instructions (Signed)
Fetal Movement Counts Patient Name: ________________________________________________ Patient Due Date: ____________________  What is a fetal movement count? A fetal movement count is the number of times that you feel your baby move during a certain amount of time. This may also be called a fetal kick count. A fetal movement count is recommended for every pregnant woman. You may be asked to start counting fetal movements as early as week 28 of your pregnancy. Pay attention to when your baby is most active. You may notice your baby's sleep and wake cycles. You may also notice things that make your baby move more. You should do a fetal movement count:  When your baby is normally most active.  At the same time each day. A good time to count movements is while you are resting, after having something to eat and drink. How do I count fetal movements? 1. Find a quiet, comfortable area. Sit, or lie down on your side. 2. Write down the date, the start time and stop time, and the number of movements that you felt between those two times. Take this information with you to your health care visits. 3. Write down your start time when you feel the first movement. 4. Count kicks, flutters, swishes, rolls, and jabs. You should feel at least 10 movements. 5. You may stop counting after you have felt 10 movements, or if you have been counting for 2 hours. Write down the stop time. 6. If you do not feel 10 movements in 2 hours, contact your health care provider for further instructions. Your health care provider may want to do additional tests to assess your baby's well-being. Contact a health care provider if:  You feel fewer than 10 movements in 2 hours.  Your baby is not moving like he or she usually does. Date: ____________ Start time: ____________ Stop time: ____________ Movements: ____________ Date: ____________ Start time: ____________ Stop time: ____________ Movements: ____________ Date: ____________  Start time: ____________ Stop time: ____________ Movements: ____________ Date: ____________ Start time: ____________ Stop time: ____________ Movements: ____________ Date: ____________ Start time: ____________ Stop time: ____________ Movements: ____________ Date: ____________ Start time: ____________ Stop time: ____________ Movements: ____________ Date: ____________ Start time: ____________ Stop time: ____________ Movements: ____________ Date: ____________ Start time: ____________ Stop time: ____________ Movements: ____________ Date: ____________ Start time: ____________ Stop time: ____________ Movements: ____________ This information is not intended to replace advice given to you by your health care provider. Make sure you discuss any questions you have with your health care provider. Document Revised: 04/02/2019 Document Reviewed: 04/02/2019 Elsevier Patient Education  2021 [employer] Signs and Symptoms of Labor Labor is the body's natural process of moving the baby and the placenta out of the uterus. The process of labor usually starts when the baby is full-term, between 37 and 40 weeks of pregnancy. Signs and symptoms that you are close to going into labor As your body prepares for labor and the birth of your baby, you may notice the following symptoms in the weeks and days before true labor starts:  Passing a small amount of thick, bloody mucus from your vagina. This is called normal bloody show or losing your mucus plug. This may happen more than a week before labor begins, or right before labor begins, as the opening of the cervix starts to widen (dilate). For some women, the entire mucus plug passes at once. For others, pieces of the mucus plug may gradually pass over several days.  Your baby moving (dropping) lower in your pelvis   to get into position for birth (lightening). When this happens, you may feel more pressure on your bladder and pelvic bone and less pressure on your ribs. This  may make it easier to breathe. It may also cause you to need to urinate more often and have problems with bowel movements.  Having "practice contractions," also called Braxton Hicks contractions or false labor. These occur at irregular (unevenly spaced) intervals that are more than 10 minutes apart. False labor contractions are common after exercise or sexual activity. They will stop if you change position, rest, or drink fluids. These contractions are usually mild and do not get stronger over time. They may feel like: ? A backache or back pain. ? Mild cramps, similar to menstrual cramps. ? Tightening or pressure in your abdomen. Other early symptoms include:  Nausea or loss of appetite.  Diarrhea.  Having a sudden burst of energy, or feeling very tired.  Mood changes.  Having trouble sleeping.   Signs and symptoms that labor has begun Signs that you are in labor may include:  Having contractions that come at regular (evenly spaced) intervals and increase in intensity. This may feel like more intense tightening or pressure in your abdomen that moves to your back. ? Contractions may also feel like rhythmic pain in your upper thighs or back that comes and goes at regular intervals. ? For first-time mothers, this change in intensity of contractions often occurs at a more gradual pace. ? Women who have given birth before may notice a more rapid progression of contraction changes.  Feeling pressure in the vaginal area.  Your water breaking (rupture of membranes). This is when the sac of fluid that surrounds your baby breaks. Fluid leaking from your vagina may be clear or blood-tinged. Labor usually starts within 24 hours of your water breaking, but it may take longer to begin. ? Some women may feel a sudden gush of fluid. ? Others notice that their underwear repeatedly becomes damp. Follow these instructions at home:  When labor starts, or if your water breaks, call your health care  provider or nurse care line. Based on your situation, they will determine when you should go in for an exam.  During early labor, you may be able to rest and manage symptoms at home. Some strategies to try at home include: ? Breathing and relaxation techniques. ? Taking a warm bath or shower. ? Listening to music. ? Using a heating pad on the lower back for pain. If you are directed to use heat:  Place a towel between your skin and the heat source.  Leave the heat on for 20-30 minutes.  Remove the heat if your skin turns bright red. This is especially important if you are unable to feel pain, heat, or cold. You may have a greater risk of getting burned.   Contact a health care provider if:  Your labor has started.  Your water breaks. Get help right away if:  You have painful, regular contractions that are 5 minutes apart or less.  Labor starts before you are [redacted] weeks along in your pregnancy.  You have a fever.  You have bright red blood coming from your vagina.  You do not feel your baby moving.  You have a severe headache with or without vision problems.  You have severe nausea, vomiting, or diarrhea.  You have chest pain or shortness of breath. These symptoms may represent a serious problem that is an emergency. Do not wait to see   if the symptoms will go away. Get medical help right away. Call your local emergency services (911 in the [place] Do not drive yourself to the hospital. Summary  Labor is your body's natural process of moving your baby and the placenta out of your uterus.  The process of labor usually starts when your baby is full-term, between 37 and 40 weeks of pregnancy.  When labor starts, or if your water breaks, call your health care provider or nurse care line. Based on your situation, they will determine when you should go in for an exam. This information is not intended to replace advice given to you by your health care provider. Make sure you discuss  any questions you have with your health care provider. Document Revised: 06/04/2020 Document Reviewed: 06/04/2020 Elsevier Patient Education  2021 Elsevier Inc.  

## 2020-11-05 NOTE — MAU Provider Note (Signed)
S: Patient is here for RN labor evaluation. Strip, vital signs, & chart Reviewed   O:  Vitals:   11/05/20 1958 11/05/20 2143  BP: 129/67 114/73  Pulse: 93 63  Resp: 18 16  Temp: 98.4 F (36.9 C) 97.9 F (36.6 C)  TempSrc:  Oral  SpO2:  98%  Weight: 82.1 kg   Height: 5\' 8"  (1.727 m)    No results found for this or any previous visit (from the past 24 hour(s)).  Dilation: 1.5 Effacement (%): 50 Cervical Position: Posterior Station: -3 Presentation: Vertex Exam by:: J.Bellamy, RN   FHR: 140 bpm, Mod Var, no Decels, 15x15 Accels UC: irregular   A: 1. False labor   2. [redacted] weeks gestation of pregnancy      P:  RN to discharge home in stable condition with return precautions & fetal kick counts  [phone] FNP 9:50 PM

## 2020-11-06 ENCOUNTER — Inpatient Hospital Stay (HOSPITAL_COMMUNITY): Payer: 59 | Admitting: Anesthesiology

## 2020-11-06 ENCOUNTER — Encounter (HOSPITAL_COMMUNITY): Payer: Self-pay | Admitting: Obstetrics and Gynecology

## 2020-11-06 ENCOUNTER — Inpatient Hospital Stay (HOSPITAL_COMMUNITY)
Admission: AD | Admit: 2020-11-06 | Discharge: 2020-11-08 | DRG: 806 | Disposition: A | Discharge status: Home / Self Care | Payer: 59 | Attending: Obstetrics | Admitting: Obstetrics

## 2020-11-06 DIAGNOSIS — Z20822 Contact with and (suspected) exposure to covid-19: Secondary | ICD-10-CM | POA: Diagnosis present

## 2020-11-06 DIAGNOSIS — Z8616 Personal history of COVID-19: Secondary | ICD-10-CM

## 2020-11-06 DIAGNOSIS — Z3A40 40 weeks gestation of pregnancy: Secondary | ICD-10-CM | POA: Diagnosis not present

## 2020-11-06 DIAGNOSIS — O26893 Other specified pregnancy related conditions, third trimester: Secondary | ICD-10-CM | POA: Diagnosis present

## 2020-11-06 LAB — CBC
HCT: 36.6 % (ref 36.0–46.0)
Hemoglobin: 12.2 g/dL (ref 12.0–15.0)
MCH: 28.6 pg (ref 26.0–34.0)
MCHC: 33.3 g/dL (ref 30.0–36.0)
MCV: 85.9 fL (ref 80.0–100.0)
Platelets: 191 10*3/uL (ref 150–400)
RBC: 4.26 MIL/uL (ref 3.87–5.11)
RDW: 13.1 % (ref 11.5–15.5)
WBC: 15.8 10*3/uL — ABNORMAL HIGH (ref 4.0–10.5)
nRBC: 0 % (ref 0.0–0.2)

## 2020-11-06 LAB — TYPE AND SCREEN
ABO/RH(D): O NEG
Antibody Screen: POSITIVE

## 2020-11-06 LAB — RESP PANEL BY RT-PCR (FLU A&B, COVID) ARPGX2
Influenza A by PCR: NEGATIVE
Influenza B by PCR: NEGATIVE
SARS Coronavirus 2 by RT PCR: NEGATIVE

## 2020-11-06 LAB — RPR: RPR Ser Ql: NONREACTIVE

## 2020-11-06 MED ORDER — OXYCODONE HCL 5 MG PO TABS
5.0000 mg | ORAL_TABLET | ORAL | Status: DC | PRN
  Administered 2020-11-07 (×2): 5 mg via ORAL
  Filled 2020-11-06 (×2): qty 1

## 2020-11-06 MED ORDER — OXYTOCIN-SODIUM CHLORIDE 30-0.9 UT/500ML-% IV SOLN
2.5000 [IU]/h | INTRAVENOUS | Status: DC
  Filled 2020-11-06: qty 500

## 2020-11-06 MED ORDER — SOD CITRATE-CITRIC ACID 500-334 MG/5ML PO SOLN: 30.0000 mL | ORAL | Status: DC | PRN

## 2020-11-06 MED ORDER — TERBUTALINE SULFATE 1 MG/ML IJ SOLN: 0.2500 mg | Freq: Once | INTRAMUSCULAR | Status: DC | PRN

## 2020-11-06 MED ORDER — OXYCODONE HCL 5 MG PO TABS
10.0000 mg | ORAL_TABLET | ORAL | Status: DC | PRN
Start: 2020-11-06 — End: 2020-11-08
  Administered 2020-11-06 – 2020-11-07 (×3): 10 mg via ORAL
  Filled 2020-11-06 (×3): qty 2

## 2020-11-06 MED ORDER — OXYCODONE-ACETAMINOPHEN 5-325 MG PO TABS: 2.0000 | ORAL_TABLET | ORAL | Status: DC | PRN

## 2020-11-06 MED ORDER — OXYCODONE-ACETAMINOPHEN 5-325 MG PO TABS: 1.0000 | ORAL_TABLET | ORAL | Status: DC | PRN

## 2020-11-06 MED ORDER — PRENATAL MULTIVITAMIN CH
1.0000 | ORAL_TABLET | Freq: Every day | ORAL | Status: DC
  Administered 2020-11-07 – 2020-11-08 (×2): 1 via ORAL
  Filled 2020-11-06 (×2): qty 1

## 2020-11-06 MED ORDER — COCONUT OIL OIL
1.0000 | TOPICAL_OIL | Status: DC | PRN
Start: 2020-11-06 — End: 2020-11-08
  Administered 2020-11-07: 1 via TOPICAL

## 2020-11-06 MED ORDER — ACETAMINOPHEN 325 MG PO TABS: 650.0000 mg | ORAL_TABLET | ORAL | Status: DC | PRN

## 2020-11-06 MED ORDER — DIPHENHYDRAMINE HCL 50 MG/ML IJ SOLN: 12.5000 mg | INTRAMUSCULAR | Status: DC | PRN

## 2020-11-06 MED ORDER — WITCH HAZEL-GLYCERIN EX PADS
1.0000 "application " | MEDICATED_PAD | CUTANEOUS | Status: DC | PRN
  Administered 2020-11-06: 1 via TOPICAL

## 2020-11-06 MED ORDER — LACTATED RINGERS AMNIOINFUSION: INTRAVENOUS | Status: DC

## 2020-11-06 MED ORDER — PHENYLEPHRINE 40 MCG/ML (10ML) SYRINGE FOR IV PUSH (FOR BLOOD PRESSURE SUPPORT): 80.0000 ug | PREFILLED_SYRINGE | INTRAVENOUS | Status: DC | PRN

## 2020-11-06 MED ORDER — FLEET ENEMA 7-19 GM/118ML RE ENEM: 1.0000 | ENEMA | RECTAL | Status: DC | PRN

## 2020-11-06 MED ORDER — DIPHENHYDRAMINE HCL 25 MG PO CAPS: 25.0000 mg | ORAL_CAPSULE | Freq: Four times a day (QID) | ORAL | Status: DC | PRN

## 2020-11-06 MED ORDER — FENTANYL-BUPIVACAINE-NACL 0.5-0.125-0.9 MG/250ML-% EP SOLN
EPIDURAL | Status: DC | PRN
  Administered 2020-11-06: 12 mL/h via EPIDURAL

## 2020-11-06 MED ORDER — METHYLERGONOVINE MALEATE 0.2 MG/ML IJ SOLN
0.2000 mg | Freq: Once | INTRAMUSCULAR | Status: AC
  Administered 2020-11-06: 0.2 mg via INTRAMUSCULAR

## 2020-11-06 MED ORDER — OXYTOCIN-SODIUM CHLORIDE 30-0.9 UT/500ML-% IV SOLN
1.0000 m[IU]/min | INTRAVENOUS | Status: DC
  Administered 2020-11-06: 2 m[IU]/min via INTRAVENOUS

## 2020-11-06 MED ORDER — SIMETHICONE 80 MG PO CHEW: 80.0000 mg | CHEWABLE_TABLET | ORAL | Status: DC | PRN

## 2020-11-06 MED ORDER — FENTANYL CITRATE (PF) 100 MCG/2ML IJ SOLN
100.0000 ug | Freq: Once | INTRAMUSCULAR | Status: AC
  Administered 2020-11-06: 100 ug via INTRAVENOUS
  Filled 2020-11-06: qty 2

## 2020-11-06 MED ORDER — OXYTOCIN BOLUS FROM INFUSION
333.0000 mL | Freq: Once | INTRAVENOUS | Status: AC
  Administered 2020-11-06: 333 mL via INTRAVENOUS

## 2020-11-06 MED ORDER — BENZOCAINE-MENTHOL 20-0.5 % EX AERO
1.0000 "application " | INHALATION_SPRAY | CUTANEOUS | Status: DC | PRN
  Administered 2020-11-06: 1 via TOPICAL
  Filled 2020-11-06: qty 56

## 2020-11-06 MED ORDER — LACTATED RINGERS IV SOLN
500.0000 mL | Freq: Once | INTRAVENOUS | Status: AC
  Administered 2020-11-06: 500 mL via INTRAVENOUS

## 2020-11-06 MED ORDER — CEFAZOLIN SODIUM-DEXTROSE 2-4 GM/100ML-% IV SOLN
2.0000 g | Freq: Once | INTRAVENOUS | Status: AC
  Administered 2020-11-06: 2 g via INTRAVENOUS
  Filled 2020-11-06: qty 100

## 2020-11-06 MED ORDER — DIBUCAINE (PERIANAL) 1 % EX OINT: 1.0000 "application " | TOPICAL_OINTMENT | CUTANEOUS | Status: DC | PRN

## 2020-11-06 MED ORDER — TETANUS-DIPHTH-ACELL PERTUSSIS 5-2.5-18.5 LF-MCG/0.5 IM SUSY: 0.5000 mL | PREFILLED_SYRINGE | Freq: Once | INTRAMUSCULAR | Status: DC

## 2020-11-06 MED ORDER — FENTANYL-BUPIVACAINE-NACL 0.5-0.125-0.9 MG/250ML-% EP SOLN
12.0000 mL/h | EPIDURAL | Status: DC | PRN
  Filled 2020-11-06: qty 250

## 2020-11-06 MED ORDER — LIDOCAINE HCL (PF) 1 % IJ SOLN
INTRAMUSCULAR | Status: DC | PRN
  Administered 2020-11-06: 5 mL via EPIDURAL

## 2020-11-06 MED ORDER — ONDANSETRON HCL 4 MG PO TABS
4.0000 mg | ORAL_TABLET | ORAL | Status: DC | PRN
  Administered 2020-11-06: 4 mg via ORAL
  Filled 2020-11-06: qty 1

## 2020-11-06 MED ORDER — IBUPROFEN 600 MG PO TABS
600.0000 mg | ORAL_TABLET | Freq: Four times a day (QID) | ORAL | Status: DC
  Administered 2020-11-06 – 2020-11-08 (×8): 600 mg via ORAL
  Filled 2020-11-06 (×8): qty 1

## 2020-11-06 MED ORDER — SENNOSIDES-DOCUSATE SODIUM 8.6-50 MG PO TABS
2.0000 | ORAL_TABLET | ORAL | Status: DC
  Administered 2020-11-07: 2 via ORAL
  Filled 2020-11-06: qty 2

## 2020-11-06 MED ORDER — EPHEDRINE 5 MG/ML INJ: 10.0000 mg | INTRAVENOUS | Status: DC | PRN

## 2020-11-06 MED ORDER — METHYLERGONOVINE MALEATE 0.2 MG/ML IJ SOLN
INTRAMUSCULAR | Status: AC
  Filled 2020-11-06: qty 1

## 2020-11-06 MED ORDER — ONDANSETRON HCL 4 MG/2ML IJ SOLN: 4.0000 mg | INTRAMUSCULAR | Status: DC | PRN

## 2020-11-06 MED ORDER — LIDOCAINE HCL (PF) 1 % IJ SOLN: 30.0000 mL | INTRAMUSCULAR | Status: DC | PRN

## 2020-11-06 MED ORDER — ONDANSETRON HCL 4 MG/2ML IJ SOLN
4.0000 mg | Freq: Four times a day (QID) | INTRAMUSCULAR | Status: DC | PRN
  Administered 2020-11-06 (×2): 4 mg via INTRAVENOUS
  Filled 2020-11-06 (×2): qty 2

## 2020-11-06 MED ORDER — LACTATED RINGERS IV SOLN
500.0000 mL | INTRAVENOUS | Status: DC | PRN
Start: 2020-11-06 — End: 2020-11-06

## 2020-11-06 MED ORDER — LACTATED RINGERS IV SOLN: INTRAVENOUS | Status: DC

## 2020-11-06 NOTE — Anesthesia Preprocedure Evaluation (Signed)
Anesthesia Evaluation  Patient identified by MRN, date of birth, ID band Patient awake    Reviewed: Allergy & Precautions, NPO status , Patient's Chart, lab work & pertinent test results  Airway Mallampati: II  TM Distance: >3 FB Neck ROM: Full    Dental no notable dental hx. (+) Teeth Intact, Dental Advisory Given   Pulmonary neg pulmonary ROS,    Pulmonary exam normal breath sounds clear to auscultation       Cardiovascular negative cardio ROS Normal cardiovascular exam Rhythm:Regular Rate:Normal     Neuro/Psych  Headaches, negative psych ROS   GI/Hepatic negative GI ROS, Neg liver ROS,   Endo/Other  negative endocrine ROS  Renal/GU negative Renal ROS     Musculoskeletal   Abdominal   Peds  Hematology Lab Results      Component                Value               Date                      WBC                      15.8 (H)            11/06/2020                HGB                      12.2                11/06/2020                HCT                      36.6                11/06/2020                MCV                      85.9                11/06/2020                PLT                      191                 11/06/2020              Anesthesia Other Findings   Reproductive/Obstetrics (+) Pregnancy                             Anesthesia Physical Anesthesia Plan  ASA: I  Anesthesia Plan: Epidural   Post-op Pain Management:    Induction:   PONV Risk Score and Plan:   Airway Management Planned:   Additional Equipment:   Intra-op Plan:   Post-operative Plan:   Informed Consent: I have reviewed the patients History and Physical, chart, labs and discussed the procedure including the risks, benefits and alternatives for the proposed anesthesia with the patient or authorized representative who has indicated his/her understanding and acceptance.       Plan Discussed with:    Anesthesia Plan Comments: (40.1 wk G1P0 for  LEA)        Anesthesia Quick Evaluation

## 2020-11-06 NOTE — Lactation Note (Signed)
This note was copied from a baby's chart. Lactation Consultation Note  Patient Name: Glenda Morrison STMHD'Q Date: 11/06/2020 Reason for consult: L&D Initial assessment;Term;Primapara;1st time breastfeeding Age:26 hours  Visited with [name], she's a P1 and reported (+) breast changes during the pregnancy; she also said she's had some leaking of colostrum. When [facility] assisted with hand expression, yellow/creamy colostrum was easily expressed; praised her for her efforts. Noticed that mom has flat nipples but her tissue is highly compressible.  Offered assistance with latch and mom agreed to latch baby to the right breast in cross cradle position. LC took baby to breast and it only took a couple of attempts for her to latch, had to reposition once, mom a little uncomfortable to hold baby due to her BP cuff, but voiced that the feeding was comfortable so far. Baby still nursing when exiting the room at the 10 minutes mark.  Reviewed normal newborn behavior, cluster feeding, feeding cues, size of baby's stomach and lactogenesis II.  Feeding plan:  1. Encouraged mom to feed baby STS 8-12 times/24 hours or sooner if feeding cues are present 2. Hand expression and spoon feeding were also encouraged  No literature provided due to the nature of this L&D consultation. FOB and GOB were mom's support people. Family reported all questions and concerns were answered, they're both aware of LC OP services and will call PRN.    Maternal Data Has patient been taught Hand Expression?: Yes Does the patient have breastfeeding experience prior to this delivery?: No  Feeding Mother's Current Feeding Choice: Breast Milk  LATCH Score Latch: Grasps breast easily, tongue down, lips flanged, rhythmical sucking.  Audible Swallowing: A few with stimulation  Type of Nipple: Flat  Comfort (Breast/Nipple): Soft / non-tender  Hold (Positioning): Assistance needed to correctly position  infant at breast and maintain latch.  LATCH Score: 7   Lactation Tools Discussed/Used    Interventions Interventions: Breast feeding basics reviewed;Education;Assisted with latch;Skin to skin;Breast massage;Hand express;Breast compression  Discharge Pump: Personal Margarette [place] DEBP at home) [facility] Program: No  Consult Status Consult Status: Follow-up Date: 11/07/20 Follow-up type: In-patient    Starkisha Tullis Venetia Constable 11/06/2020, 5:01 PM

## 2020-11-06 NOTE — Lactation Note (Signed)
This note was copied from a baby's chart. Lactation Consultation Note  Patient Name: Glenda Morrison DTHYH'O Date: 11/06/2020   Age:26 hours  Attempt to visit with mom but L&D team was working with her and (suturing); and asked LC to come back later. LC will attempt to visit again once they're done.  Maternal Data    Feeding    LATCH Score                    Lactation Tools Discussed/Used    Interventions    Discharge    Consult Status      Nazier Neyhart Venetia Constable 11/06/2020, 4:46 PM

## 2020-11-06 NOTE — Progress Notes (Addendum)
CTBS by RN regarding variable decelerations.  Reports recurrent and deepening variable decelerations with contractions over the last two hours.    BP 107/72   Pulse (!) 58   Temp 97.7 F (36.5 C) (Oral)   Resp 18   Ht 5\' 8"  (1.727 m)   Wt 82.1 kg   LMP 01/30/2020 (Exact Date)   SpO2 100%   BMI 27.52 kg/m   Toco: q2-3 minutes EFM: 130s, moderate variability, variable decels with contractions, catgory 2 SVE: 8/100/-2  A/P: G1 @ [redacted]w[redacted]d with labor Rapid cervical change from 4 to 8 cm over last 2.5 hours.  IUPC placed and amnioinfusion started.  Overall variability between contractions remains moderate.  Will monitor closely  ADDENDUM 1436:   Variable decelerations have resolved.  Baseline 120s, moderate variability.  Continue pitocin

## 2020-11-06 NOTE — Anesthesia Procedure Notes (Signed)
Epidural Patient location during procedure: OB Start time: 11/06/2020 7:57 AM End time: 11/06/2020 8:12 AM  Staffing Anesthesiologist: Trevor Iha, MD Performed: anesthesiologist   Preanesthetic Checklist Completed: patient identified, IV checked, site marked, risks and benefits discussed, surgical consent, monitors and equipment checked, pre-op evaluation and timeout performed  Epidural Patient position: sitting Prep: DuraPrep and site prepped and draped Patient monitoring: continuous pulse ox and blood pressure Approach: midline Location: L3-L4 Injection technique: LOR air  Needle:  Needle type: Tuohy  Needle gauge: 17 G Needle length: 9 cm and 9 Needle insertion depth: 5 cm cm Catheter type: closed end flexible Catheter size: 19 Gauge Catheter at skin depth: 10 cm Test dose: negative  Assessment Events: blood not aspirated, injection not painful, no injection resistance, no paresthesia and negative IV test  Additional Notes Patient identified. Risks/Benefits/Options discussed with patient including but not limited to bleeding, infection, nerve damage, paralysis, failed block, incomplete pain control, headache, blood pressure changes, nausea, vomiting, reactions to medication both or allergic, itching and postpartum back pain. Confirmed with bedside nurse the patient's most recent platelet count. Confirmed with patient that they are not currently taking any anticoagulation, have any bleeding history or any family history of bleeding disorders. Patient expressed understanding and wished to proceed. All questions were answered. Sterile technique was used throughout the entire procedure. Please see nursing notes for vital signs. Test dose was given through epidural needle and negative prior to continuing to dose epidural or start infusion. Warning signs of high block given to the patient including shortness of breath, tingling/numbness in hands, complete motor block, or any  concerning symptoms with instructions to call for help. Patient was given instructions on fall risk and not to get out of bed. All questions and concerns addressed with instructions to call with any issues. 1 Attempt (S) . Patient tolerated procedure well.

## 2020-11-06 NOTE — Plan of Care (Signed)

## 2020-11-06 NOTE — H&P (Signed)
25 y.o. G1P0 @ [redacted]w[redacted]d presents with contractions.  She was seen in the MAU earlier this evening but was contracting infrequently and only 1 cm dilated, so she was discharged home.   Upon return, she was had progressed to 3cm, but was still contracting infrequently every 5-7 minutes, but she is having difficulty coping with pain and MAU recommended admission.  Otherwise has good fetal movement and no bleeding.    Pregnancy complicated by: 1. Pyelonephritis:  Admitted at 29 weeks.  Has been on prophylactic keflex since that time 2. Covid in third trimester at 29 weeks  Past Medical History:  Diagnosis Date  . Frequent headaches   . GERD (gastroesophageal reflux disease)     Past Surgical History:  Procedure Laterality Date  . NO PAST SURGERIES    . UPPER GI ENDOSCOPY    . WISDOM TOOTH EXTRACTION      OB History  Gravida Para Term Preterm AB Living  1            SAB IAB Ectopic Multiple Live Births               # Outcome Date GA Lbr Len/2nd Weight Sex Delivery Anes PTL Lv  1 Current             Social History   Socioeconomic History  . Marital status: Married    Spouse name: Not on file  . Number of children: Not on file  . Years of education: Not on file  . Highest education level: Not on file  Occupational History    Comment: [employer]  Tobacco Use  . Smoking status: Never Smoker  . Smokeless tobacco: Never Used  Vaping Use  . Vaping Use: Never used  Substance and Sexual Activity  . Alcohol use: Not Currently    Alcohol/week: 0.0 standard drinks    Comment: Socially  . Drug use: No  . Sexual activity: Yes  Other Topics Concern  . 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    Prenatal Transfer Tool  Maternal Diabetes: No Genetic Screening: Declined Maternal Ultrasounds/Referrals: Normal Fetal Ultrasounds or other Referrals:  None Maternal Substance Abuse:  No Significant Maternal Medications:  Meds include: Other:  keflex Significant Maternal Lab Results: Group B Strep negative  ABO, Rh: --/--/PENDING (03/13 0654) Antibody: PENDING (03/13 0654) Rubella: Immune (07/22 0000) RPR: Nonreactive (12/21 0000)  HBsAg: Negative (07/22 0000)  HIV: Non-reactive (07/22 0000)  GBS: Negative/-- (02/16 0000)     Vitals:   11/06/20 0540 11/06/20 0718  BP:  109/71  Pulse:  71  Resp:  17  Temp:  98.8 F (37.1 C)  SpO2: 100%      General:  NAD Abdomen:  soft, gravid, EFW 6.5# Ex:  no edema SVE:  4/90/-2, AROM clear fluid FHTs:  130s, moderate variability, + accelerations, category 1 Toco:  q4-5 minutes   A/P   25 y.o. G1P0 [redacted]w[redacted]d presents with early labor  Comfortable with epidural Pitocin started on admission for early labor with infrequent contractions, no s/p AROM.   FSR/ vtx/ GBS negative  Glenda Morrison

## 2020-11-07 LAB — CBC
HCT: 30.6 % — ABNORMAL LOW (ref 36.0–46.0)
Hemoglobin: 10.1 g/dL — ABNORMAL LOW (ref 12.0–15.0)
MCH: 29 pg (ref 26.0–34.0)
MCHC: 33 g/dL (ref 30.0–36.0)
MCV: 87.9 fL (ref 80.0–100.0)
Platelets: 167 10*3/uL (ref 150–400)
RBC: 3.48 MIL/uL — ABNORMAL LOW (ref 3.87–5.11)
RDW: 13.2 % (ref 11.5–15.5)
WBC: 12.4 10*3/uL — ABNORMAL HIGH (ref 4.0–10.5)
nRBC: 0 % (ref 0.0–0.2)

## 2020-11-07 MED ORDER — RHO D IMMUNE GLOBULIN 1500 UNIT/2ML IJ SOSY
300.0000 ug | PREFILLED_SYRINGE | Freq: Once | INTRAMUSCULAR | Status: AC
  Administered 2020-11-07: 300 ug via INTRAVENOUS
  Filled 2020-11-07: qty 2

## 2020-11-07 NOTE — Progress Notes (Signed)
Post Partum Day 1 Subjective: no complaints, up ad lib, voiding and tolerating PO  Objective: Blood pressure 103/65, pulse (!) 57, temperature 97.9 F (36.6 C), temperature source Oral, resp. rate 18, height 5\' 8"  (1.727 m), weight 82.1 kg, last menstrual period 01/30/2020, SpO2 100 %, unknown if currently breastfeeding.  Physical Exam:  General: alert, cooperative and appears stated age Lochia: appropriate Uterine Fundus: firm DVT Evaluation: No evidence of DVT seen on physical exam.  Recent Labs    11/06/20 0653 11/07/20 0429  HGB 12.2 10.1*  HCT 36.6 30.6*    Assessment/Plan: Plan for discharge tomorrow  Breastfeeding   LOS: 1 day   11/09/20 11/07/2020, 9:48 AM

## 2020-11-07 NOTE — Lactation Note (Signed)
This note was copied from a baby's chart. Lactation Consultation Note  Patient Name: Glenda Morrison [id number] Date: 11/07/2020 Reason for consult: Follow-up assessment;Primapara;1st time breastfeeding;Difficult latch;Term;Nipple pain/trauma Age:26 hours  Follow up visit to 31 hours old infant with 2.85% weight loss at the time of consult. Infant is latch cradle position, left breast upon arrival. FOI is present and supportive. Mother states her nipples are really sore. Mother also explains she has been breastfeeding for 1 hour. LC and LC student observed sub-optimal position and a shallow latch. Offered assistance with latch. Hand expressed colostrum to stimulate infant. Infant able to get a deep latch. Infant breastfed for ~20 minutes. LC student demonstrated hand expression technique, collected ~24mL of EBM total. Infant took ~6 mL of EBM via spoon. Infant had a void, LC changed.  Encouraged mother to used EBM to nipples, air-dry and use nipple balm for healing purposes. [facility] student demonstrated using a hand pump for supplementation purposes. LC reviewed normal newborn behavior during second day of life and while clusterfeeding. Reinforced the importance of stimulate her breasts. Talked about pacifier use. Discussed benefits of skin to skin.    Feeding plan:  1. Breastfeed following hunger cues.  2. Stimulate infant awake at the breast 3. Offer breast 8-12 times in 24h period to establish good milk supply.   4. Pump or hand-express and offer first EBM. 5. Monitor voids and stools as signs good intake 6. Encouraged maternal rest, hydration and food intake.  7. Contact Lactation Services or local resources for support, questions or concerns.    All questions answered at this time. Promoted [employer] booklet for additional information.    Maternal Data Has patient been taught Hand Expression?: Yes Does the patient have breastfeeding experience prior to this delivery?: No  Feeding Mother's  Current Feeding Choice: Breast Milk  LATCH Score Latch: Grasps breast easily, tongue down, lips flanged, rhythmical sucking.  Audible Swallowing: Spontaneous and intermittent  Type of Nipple: Everted at rest and after stimulation (short shafted)  Comfort (Breast/Nipple): Filling, red/small blisters or bruises, mild/mod discomfort  Hold (Positioning): Assistance needed to correctly position infant at breast and maintain latch.  LATCH Score: 8   Lactation Tools Discussed/Used Tools: Pump;Flanges Flange Size: 27 Breast pump type: Manual Pump Education: Setup, frequency, and cleaning;Milk Storage Reason for Pumping: supplementation Pumping frequency: as needed Pumped volume: 2 mL  Interventions Interventions: Breast feeding basics reviewed;Assisted with latch;Skin to skin;Breast massage;Hand express  Discharge Pump: Personal  Consult Status Consult Status: Follow-up Date: 11/08/20 Follow-up type: In-patient    Reese Stockman A Higuera Ancidey 11/07/2020, 11:47 PM

## 2020-11-07 NOTE — Anesthesia Postprocedure Evaluation (Signed)
Anesthesia Post Note  Patient: Glenda Morrison  Procedure(s) Performed: AN AD HOC LABOR EPIDURAL     Patient location during evaluation: Mother Baby Anesthesia Type: Epidural Level of consciousness: awake and alert Pain management: pain level controlled Vital Signs Assessment: post-procedure vital signs reviewed and stable Respiratory status: spontaneous breathing, nonlabored ventilation and respiratory function stable Cardiovascular status: stable Postop Assessment: no headache, no backache, epidural receding, patient able to bend at knees, adequate PO intake and able to ambulate Anesthetic complications: no   No complications documented.  Last Vitals:  Vitals:   11/07/20 0344 11/07/20 0800  BP: 106/75 103/65  Pulse: (!) 56 (!) 57  Resp: 18 18  Temp: 36.6 C 36.6 C  SpO2: 100% 100%    Last Pain:  Vitals:   11/07/20 0800  TempSrc: Oral  PainSc:    Pain Goal:                   [name]

## 2020-11-08 LAB — RH IG WORKUP (INCLUDES ABO/RH)
ABO/RH(D): O NEG
Fetal Screen: NEGATIVE
Gestational Age(Wks): 40.1
Unit division: 0

## 2020-11-08 NOTE — Lactation Note (Signed)
This note was copied from a baby's chart. Lactation Consultation Note  Patient Name: Glenda Morrison [id number] Date: 11/08/2020 Reason for consult: Primapara Age:26 hours  Mom was placed in side-lying position to help infant latch using the teacup hold. Infant latched with ease. Visual cues for swallows noted (and verified by cervical auscultation). Much of the time, infant's suck:swallow ratio was 1:1. Infant was able to self-latch at the next feeding. I have no concerns about milk transfer for this infant.  Mom's milk is coming to volume! She was leaking from opposite breast while infant was feeding on the other breast. Breast management discussed to keep breasts comfortable. Specifics of an asymmetric latch shown via [employer].   Mom noted to have extramammary nipples bilaterally in axilla. A portion of Mom's top R nipple tends to stick out more when infant releases latch; I feel that is not indicative of a bad latch, but rather, that portion of her nipple is more elastic. In that same aspect of her nipple, there is what appears to be a small bleb, but it is not painful to the touch.   Mom knows to finish the first breast first & to pump for comfort if the other breast becomes too full to wait for the next feeding.   Parents know how to reach [place] for post-discharge questions.   Glenda Morrison/[facility] 11/08/2020, 9:28 AM

## 2020-11-08 NOTE — Progress Notes (Addendum)
CSW received consult due to score 10 on Edinburgh Depression Screen.    CSW met with the MOB at bedside, introduced role and reason for the visit. MOB receptive to CSW visit. FOB Jacob, present at the time. CSW asked MOB if she wanted FOB to stay or leave for privacy. MOB agreeable for FOB to stay. MOB reports having feelings of sadness during her first trimester of pregnancy. MOB reports she felt fine later on during the pregnancy. MOB denies SI/HI now, and during her pregnancy. MOB denies any hx mental health history. CSW provided education regarding Baby Blues vs PMADs and provided MOB with resources for mental health follow up. MOB receptive to the resources. CSW encouraged MOB to evaluate her mental health throughout the postpartum period with the use of the New Mom Checklist developed by Postpartum Progress as well as the Edinburgh Postnatal Depression Scale and notify a medical professional if symptoms arise. MOB report understanding. MOB reports having support from the FOB, her eleven siblings, parents. MOB reports her family is very excited about the baby. MOB reports having a car seat and bassinet for the baby. CSW provided education on Sudden infant Death Syndrome (SIDS). MOB reports understanding.  MOB has established a Pediatrician and also applied for [place] medicaid. MOB hopes the baby will qualify for medicaid. MOB decline resources for WIC.  CSW assessed for additional needs.   CSW identifies no further needs for intervention. No barriers to discharge at this time.   Glenda Foor  LCSW, MSW Clinical Social Worker  11/08/2020  12:00 PM     

## 2020-11-08 NOTE — Discharge Summary (Signed)
Postpartum Discharge Summary  Date of Service updated      Patient Name: Glenda Morrison DOB: 1996-04-28 MRN: [id number]  Date of admission: 11/06/2020 Delivery date:11/06/2020  Delivering provider: Jerelyn Charles  Date of discharge: 11/08/2020  Admitting diagnosis: Normal labor [O80, Z37.9] Intrauterine pregnancy: [redacted]w[redacted]d    Secondary diagnosis:  Active Problems:   Normal labor  Additional problems: none    Discharge diagnosis: Term Pregnancy Delivered                                              Post partum procedures:rhogam Augmentation: Pitocin Complications: None  Hospital course: Onset of Labor With Vaginal Delivery      25y.o. yo G1P1001 at 40w1das admitted in Latent Labor on 11/06/2020. Patient had an uncomplicated labor course as follows:  Membrane Rupture Time/Date: 11:34 AM ,11/06/2020   Delivery Method:Vaginal, Spontaneous  Episiotomy: None  Lacerations:  Labial;Sulcus  Patient had an uncomplicated postpartum course.  She is ambulating, tolerating a regular diet, passing flatus, and urinating well. Patient is discharged home in stable condition on 11/08/20.  Newborn Data: Birth date:11/06/2020  Birth time:3:52 PM  Gender:Female  Living status:Living  Apgars:8 ,9  Weight:2985 g   Magnesium Sulfate received: No BMZ received: No Rhophylac:Yes MMR:No T-DaP:Given prenatally Flu: No Transfusion:No  Physical exam  Vitals:   11/07/20 0800 11/07/20 1330 11/07/20 2030 11/08/20 0545  BP: 103/65 116/74 116/75 119/80  Pulse: (!) 57 67 65 69  Resp: _0 Temp: 97.9 F (36.6 C) 98.4 F (36.9 C) 98.1 F (36.7 C) 98 F (36.7 C)  TempSrc: Oral Oral Oral Oral  SpO2: 100%  100% 100%  Weight:      Height:       Labs: Lab Results  Component Value Date   WBC 12.4 (H) 11/07/2020   HGB 10.1 (L) 11/07/2020   HCT 30.6 (L) 11/07/2020   MCV 87.9 11/07/2020   PLT 167 11/07/2020   CMP Latest Ref Rng & Units 08/22/2020  Glucose 70 - 99 mg/dL 84  BUN 6 -  20 mg/dL 5(L)  Creatinine 0.44 - 1.00 mg/dL 0.45  Sodium 135 - 145 mmol/L 135  Potassium 3.5 - 5.1 mmol/L 3.5  Chloride 98 - 111 mmol/L 103  CO2 22 - 32 mmol/L 22  Calcium 8.9 - 10.3 mg/dL 8.7(L)  Total Protein 6.5 - 8.1 g/dL 6.1(L)  Total Bilirubin 0.3 - 1.2 mg/dL 0.6  Alkaline Phos 38 - 126 U/L 63  AST 15 - 41 U/L 21  ALT 0 - 44 U/L 13   Edinburgh Score: Edinburgh Postnatal Depression Scale Screening Tool 11/07/2020  I have been able to laugh and see the funny side of things. 0  I have looked forward with enjoyment to things. 0  I have blamed myself unnecessarily when things went wrong. 2  I have been anxious or worried for no good reason. 3  I have felt scared or panicky for no good reason. 3  Things have been getting on top of me. 1  I have been so unhappy that I have had difficulty sleeping. 0  I have felt sad or miserable. 0  I have been so unhappy that I have been crying. 1  The thought of harming myself has occurred to me. 0  Edinburgh Postnatal Depression Scale Total 10  After visit meds:  Allergies as of 11/08/2020   No Known Allergies     Medication List    TAKE these medications   acetaminophen 325 MG tablet Commonly known as: TYLENOL Take 650 mg by mouth every 6 (six) hours as needed for mild pain or headache.   albuterol 108 (90 Base) MCG/ACT inhaler Commonly known as: VENTOLIN HFA Inhale 1-2 puffs into the lungs every 6 (six) hours as needed for wheezing or shortness of breath.   cephALEXin 500 MG capsule Commonly known as: KEFLEX Take 500 mg by mouth at bedtime.   diphenhydrAMINE 25 MG tablet Commonly known as: BENADRYL Take 25 mg by mouth at bedtime as needed for sleep.   prenatal multivitamin Tabs tablet Take 1 tablet by mouth at bedtime.            Discharge Care Instructions  (From admission, onward)         Start     Ordered   11/08/20 0000  Discharge wound care:       Comments: Sitz baths and icepacks to perineum.  If  stitches, they will dissolve.   11/08/20 0745           Discharge home in stable condition Infant Feeding: ? Infant Disposition:home with mother Discharge instruction: per After Visit Summary and Postpartum booklet. Activity: Advance as tolerated. Pelvic rest for 6 weeks.  Diet: routine diet Anticipated Birth Control: Unsure Postpartum Appointment:4 weeks Additional Postpartum F/U: none Future Appointments:No future appointments. Follow up Visit:  Follow-up Information    Jerelyn Charles, MD Follow up in 4 week(s).   Specialty: Obstetrics Contact information: [address] [place] [address] [phone]                   11/08/2020 Daria Pastures, MD

## 2020-11-08 NOTE — Progress Notes (Signed)
Patient is eating, ambulating, voiding.  Pain control is good.  Vitals:   11/07/20 0800 11/07/20 1330 11/07/20 2030 11/08/20 0545  BP: 103/65 116/74 116/75 119/80  Pulse: (!) 57 67 65 69  Resp: 18 18 18 18   Temp: 97.9 F (36.6 C) 98.4 F (36.9 C) 98.1 F (36.7 C) 98 F (36.7 C)  TempSrc: Oral Oral Oral Oral  SpO2: 100%  100% 100%  Weight:      Height:        Fundus firm Perineum without swelling.  Lab Results  Component Value Date   WBC 12.4 (H) 11/07/2020   HGB 10.1 (L) 11/07/2020   HCT 30.6 (L) 11/07/2020   MCV 87.9 11/07/2020   PLT 167 11/07/2020    --/--/O NEG (03/14 0429)/RI  A/P Post partum day 2.  Routine care.  Expect d/c today.  S/p Rhogam, baby RH pos.  08-17-1979

## 2020-12-08 LAB — HM PAP SMEAR

## 2021-01-30 ENCOUNTER — Encounter: Payer: Self-pay | Admitting: Family Medicine

## 2021-03-03 IMAGING — US US OB < 14 WEEKS - US OB TV
1 series · 15 of 28 positions shown · non-contrast
Comparison: None

CLINICAL DATA: Pregnancy of unknown anatomic location, LMP
01/30/2020

EXAM:
OBSTETRIC <14 WK US AND TRANSVAGINAL OB US
TECHNIQUE: Both transabdominal and transvaginal ultrasound examinations were
performed for complete evaluation of the gestation as well as the
maternal uterus, adnexal regions, and pelvic cul-de-sac.
Transvaginal technique was performed to assess early pregnancy.

[Series 1: us ob < 14 weeks - us ob tv · 69 acquisitions, 15 frames shown]
[im 1/69]
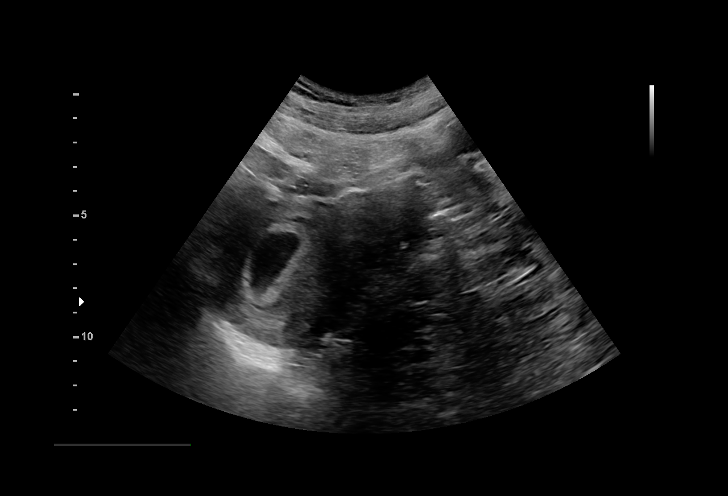
[im 6/69]
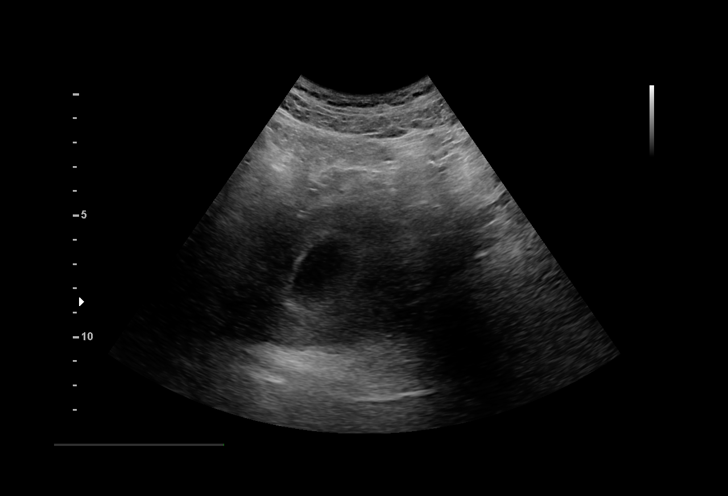
[im 11/69]
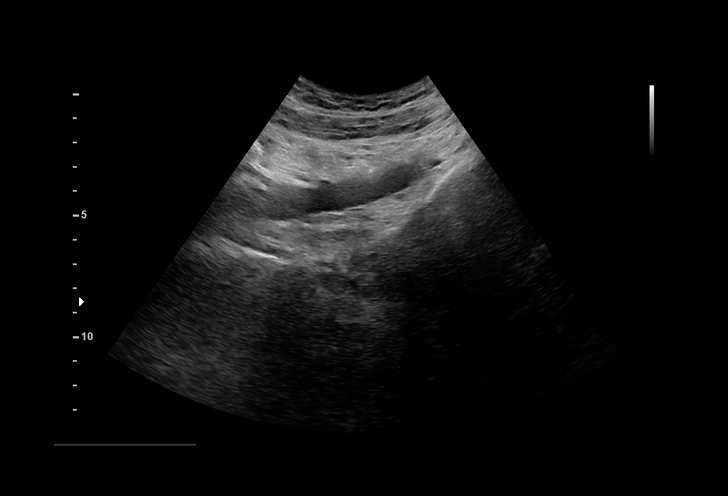
[im 16/69]
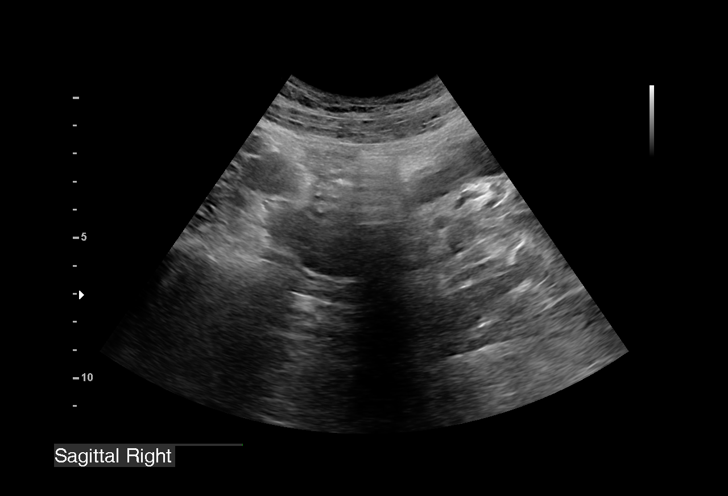
[im 21/69]
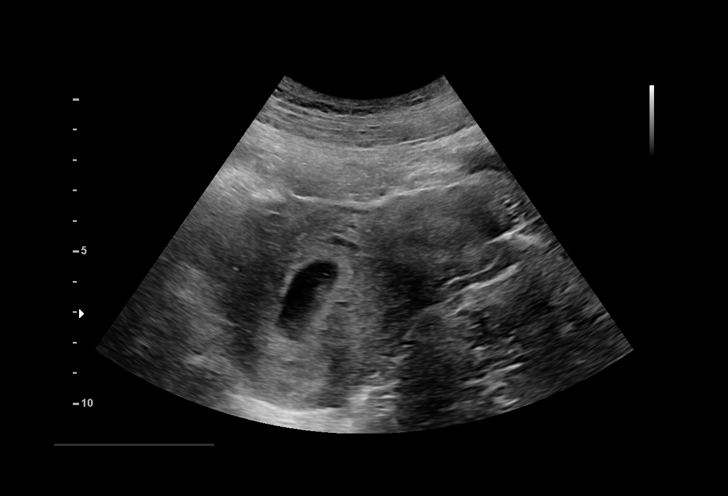
[im 26/69]
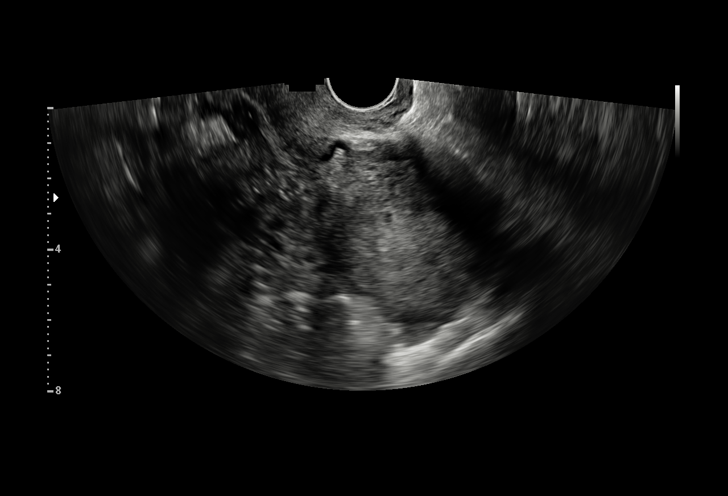
[im 31/69]
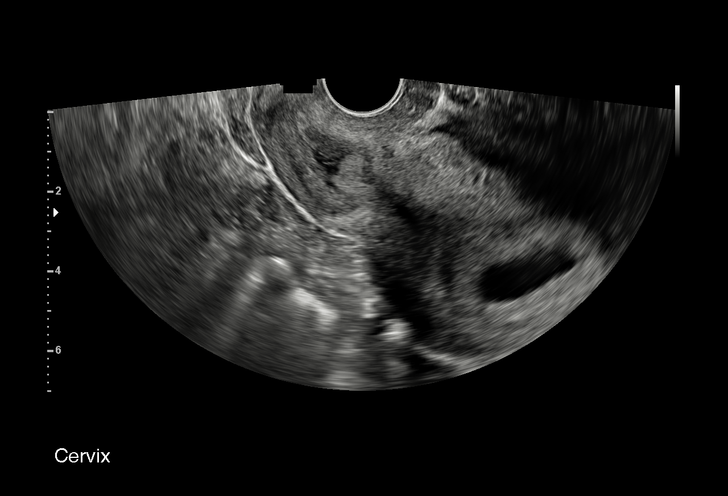
[im 36/69]
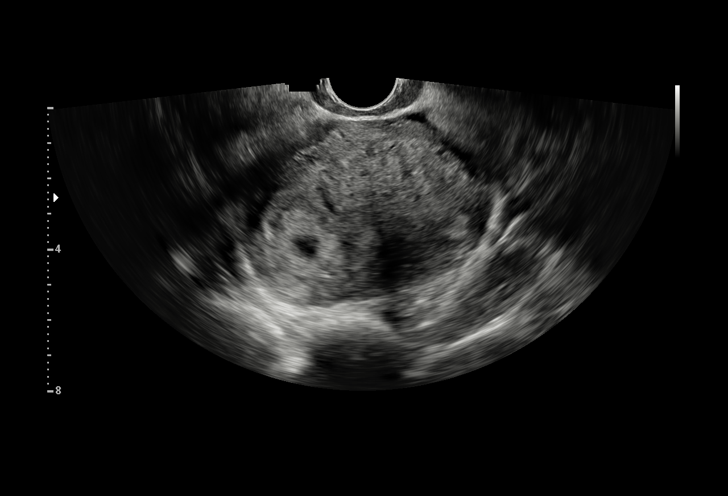
[im 38/69]
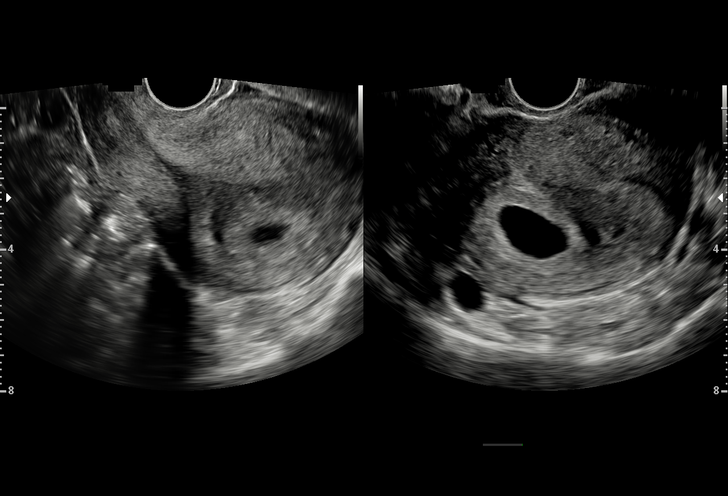
[im 43/69]
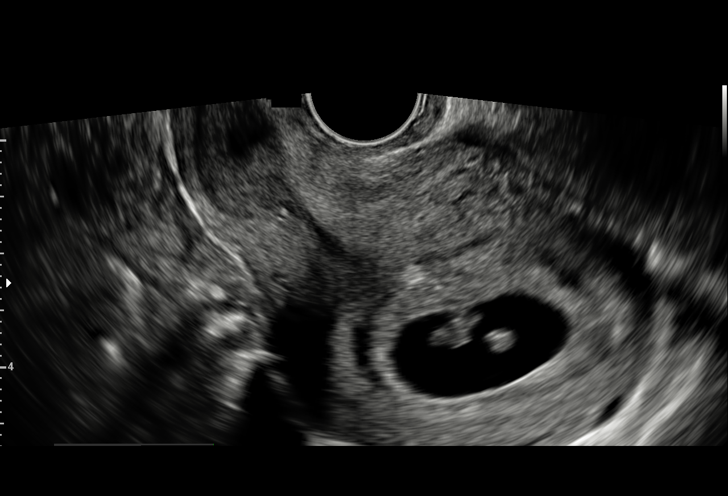
[im 48/69]
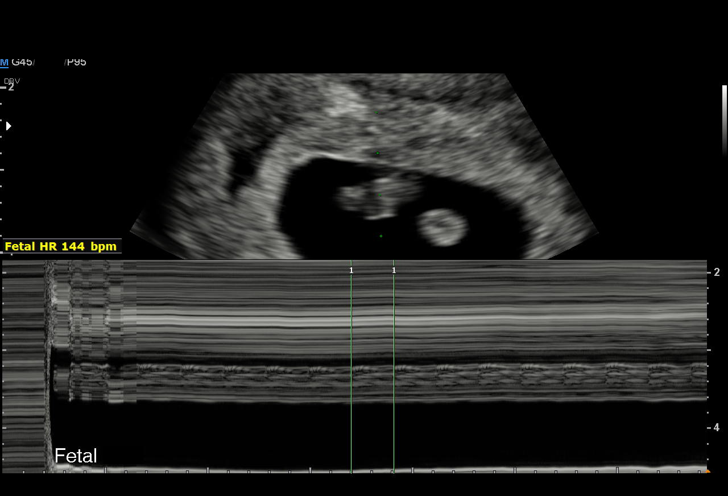
[im 53/69]
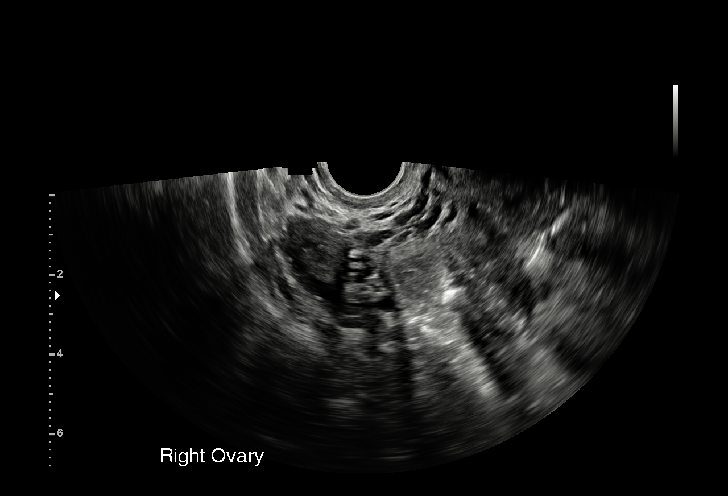
[im 58/69]
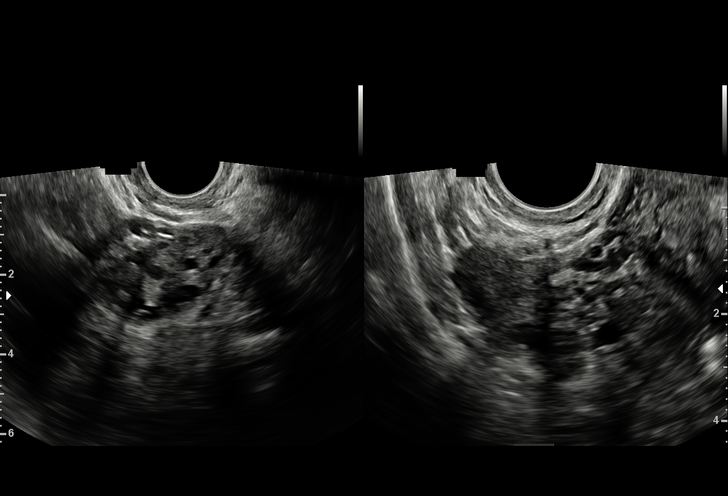
[im 63/69]
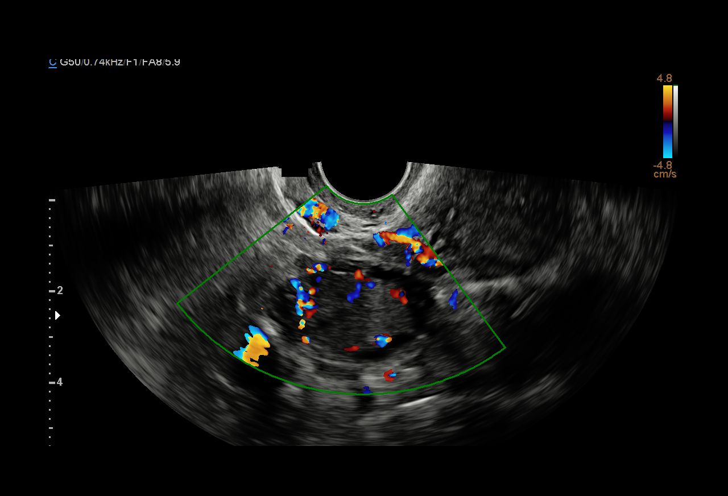
[im 69/69]
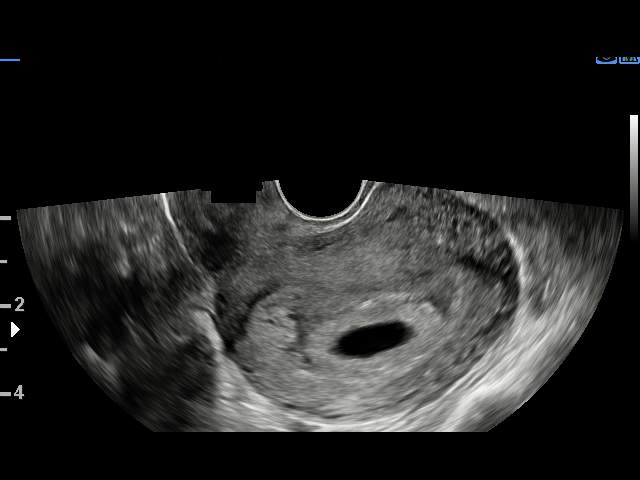

[15 of 28 positions shown; findings below may reference images not displayed]

FINDINGS: Intrauterine gestational sac: Present, single

Yolk sac:  Present

Embryo:  Present

Cardiac Activity: Present

Heart Rate: 144 bpm

CRL:  11.1 mm   7 w   1 d                  US EDC: 11/02/2020

Subchorionic hemorrhage:  Small subchronic hemorrhage

Maternal uterus/adnexae:

Retroverted uterus.

RIGHT ovary normal size and morphology, 3.7 x 2.1 x 1.5 cm.

LEFT ovary measures 2.1 x 4.0 x 2.3 cm and contains a small corpus
luteum.

Trace free pelvic fluid.

No adnexal masses.
IMPRESSION: Single live intrauterine gestation at 7 weeks 1 day EGA by
crown-rump length.

Small subchronic hemorrhage.

## 2021-05-10 DIAGNOSIS — G43909 Migraine, unspecified, not intractable, without status migrainosus: Secondary | ICD-10-CM | POA: Insufficient documentation

## 2021-05-10 DIAGNOSIS — J4520 Mild intermittent asthma, uncomplicated: Secondary | ICD-10-CM | POA: Insufficient documentation

## 2021-05-11 ENCOUNTER — Ambulatory Visit
Admission: EM | Admit: 2021-05-11 | Discharge: 2021-05-11 | Disposition: A | Discharge status: Home / Self Care | Payer: 59 | Attending: Emergency Medicine | Admitting: Emergency Medicine

## 2021-05-11 ENCOUNTER — Other Ambulatory Visit: Payer: Self-pay

## 2021-05-11 DIAGNOSIS — Z1152 Encounter for screening for COVID-19: Secondary | ICD-10-CM | POA: Diagnosis not present

## 2021-05-11 DIAGNOSIS — B349 Viral infection, unspecified: Secondary | ICD-10-CM | POA: Diagnosis not present

## 2021-05-11 HISTORY — DX: Unspecified asthma, uncomplicated: J45.909

## 2021-05-11 NOTE — ED Triage Notes (Signed)
Pt presents with cough, chest congestion, tightness with breathing, fever x 2 days.  Temp 102 overnight, last took meds 0200 - Tylenol which broke fever.  At home COVID was negative but pt is agreeable to COVID/flu test.  Does not feel it is strep.  Reports pain in bilateral ears, HA, throat, etc.  Has not been able to find inhaler but has refill processing through pharmacy.

## 2021-05-11 NOTE — ED Provider Notes (Signed)
Renaldo Fiddler    CSN: [id number] Arrival date & time: 05/11/21  0102      History   Chief Complaint Chief Complaint  Patient presents with   Cough    HPI Glenda Morrison is a 25 y.o. female.  Patient presents with 2-day history of fever, headache, ear pain, sore throat, congestion, cough, tightness with breathing, occasional wheezing, shortness of breath.  T-max 102.  Treatment at home with Tylenol; last dose at 2 AM.  She denies rash, chest pain, abdominal pain, vomiting, diarrhea, or other symptoms.  She is currently out of her albuterol inhaler but has a refill at the pharmacy being filled now.  Her medical history includes asthma.  The history is provided by the patient and medical records.   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 Authorizing Provider  acetaminophen (TYLENOL) 325 MG tablet Take 650 mg by mouth every 6 (six) hours as needed for mild pain or headache.   Yes [provider]  albuterol (PROVENTIL HFA;VENTOLIN HFA) 108 (90 Base) MCG/ACT inhaler Inhale 1-2 puffs into the lungs every 6 (six) hours as needed for wheezing or shortness of breath.   Yes [provider]   diphenhydrAMINE (BENADRYL) 25 MG tablet Take 25 mg by mouth at bedtime as needed for sleep.   Yes [provider]  Prenatal Vit-Fe Fumarate-FA (PRENATAL MULTIVITAMIN) TABS tablet Take 1 tablet by mouth at bedtime.   Yes [provider]  cephALEXin (KEFLEX) 500 MG capsule Take 500 mg by mouth at bedtime.    [provider]    Family History Family History  Problem Relation Age of Onset   Healthy Mother    Arthritis Father    Stroke Father    Pancreatic cancer Maternal Uncle    Stroke Paternal Uncle    Arthritis Maternal Grandmother    Esophageal cancer Maternal Grandmother    Cancer Maternal Grandmother        Stomach   Pancreatic cancer Maternal Grandfather    Alcohol abuse Paternal Grandfather    Breast cancer Other        great grandmother    Social History Social History   Tobacco Use   Smoking status: Never   Smokeless tobacco: Never  Vaping Use   Vaping Use: Never used  Substance Use Topics   Alcohol use: Not Currently    Alcohol/week: 0.0 standard drinks  Comment: Socially   Drug use: No     Allergies   Patient has no known allergies.   Review of Systems Review of Systems  Constitutional:  Negative for chills and fever.  HENT:  Positive for congestion, ear pain and sore throat.   Respiratory:  Positive for cough, chest tightness, shortness of breath and wheezing.   Cardiovascular:  Negative for chest pain and palpitations.  Gastrointestinal:  Negative for abdominal pain, diarrhea and vomiting.  Skin:  Negative for color change and rash.  All other systems reviewed and are negative.   Physical Exam Triage Vital Signs ED Triage Vitals  Enc Vitals Group     BP 05/11/21 1028 (!) 130/94     Pulse Rate 05/11/21 1028 69     Resp 05/11/21 1028 18     Temp 05/11/21 1028 98.8 F (37.1 C)     Temp Source 05/11/21 1028 Oral     SpO2 05/11/21 1028 99 %     Weight --      Height --      Head Circumference --      Peak Flow --       Pain Score 05/11/21 1026 7     Pain Loc --      Pain Edu? --      Excl. in GC? --    No data found.  Updated Vital Signs BP (!) 130/94 (BP Location: Left Arm)   Pulse 69   Temp 98.8 F (37.1 C) (Oral)   Resp 18   SpO2 99%   Breastfeeding Yes   Visual Acuity Right Eye Distance:   Left Eye Distance:   Bilateral Distance:    Right Eye Near:   Left Eye Near:    Bilateral Near:     Physical Exam Vitals and nursing note reviewed.  Constitutional:      General: She is not in acute distress.    Appearance: She is well-developed. She is not ill-appearing.  HENT:     Head: Normocephalic and atraumatic.     Right Ear: Tympanic membrane normal.     Left Ear: Tympanic membrane normal.     Nose: Rhinorrhea present.     Mouth/Throat:     Mouth: Mucous membranes are moist.     Pharynx: Oropharynx is clear.  Eyes:     Conjunctiva/sclera: Conjunctivae normal.  Cardiovascular:     Rate and Rhythm: Normal rate and regular rhythm.     Heart sounds: Normal heart sounds.  Pulmonary:     Effort: Pulmonary effort is normal. No respiratory distress.     Breath sounds: Normal breath sounds. No wheezing.  Abdominal:     Palpations: Abdomen is soft.     Tenderness: There is no abdominal tenderness.  Musculoskeletal:     Cervical back: Neck supple.  Skin:    General: Skin is warm and dry.  Neurological:     General: No focal deficit present.     Mental Status: She is alert and oriented to person, place, and time.     Gait: Gait normal.  Psychiatric:        Mood and Affect: Mood normal.        Behavior: Behavior normal.     UC Treatments / Results  Labs (all labs ordered are listed, but only abnormal results are displayed) Labs Reviewed  COVID-19, FLU A+B NAA    EKG   Radiology No results found.  Procedures Procedures (including critical care time)  Medications Ordered  in UC Medications - No data to display  Initial Impression / Assessment and Plan / UC Course   I have reviewed the triage vital signs and the nursing notes.  Pertinent labs & imaging results that were available during my care of the patient were reviewed by me and considered in my medical decision making (see chart for details).   Viral illness.  No respiratory distress; lungs are clear; O2 sat 99% on room air.  COVID and Flu pending.  Instructed patient to self quarantine per CDC guidelines.  Instructed her to use her albuterol inhaler as directed; she is picking up her inhaler from the pharmacy after she leaves here.  Discussed symptomatic treatment including Tylenol or ibuprofen, rest, hydration.  Instructed patient to follow up with PCP if symptoms are not improving.  Patient agrees to plan of care.    Final Clinical Impressions(s) / UC Diagnoses   Final diagnoses:  Viral illness     Discharge Instructions      Your COVID and Flu tests are pending.  You should self quarantine until the results are back.    Use your albuterol inhaler as directed.  Take Tylenol or ibuprofen as needed for fever or discomfort.  Rest and keep yourself hydrated.    Follow-up with your primary care provider if your symptoms are not improving.    Go to the emergency department if you have acute shortness of breath or other concerning symptoms.         ED Prescriptions   None    PDMP not reviewed this encounter.   Mickie Bail, NP 05/11/21 1047

## 2021-05-11 NOTE — Discharge Instructions (Addendum)
Your COVID and Flu tests are pending.  You should self quarantine until the results are back.    Use your albuterol inhaler as directed.  Take Tylenol or ibuprofen as needed for fever or discomfort.  Rest and keep yourself hydrated.    Follow-up with your primary care provider if your symptoms are not improving.    Go to the emergency department if you have acute shortness of breath or other concerning symptoms.

## 2021-05-13 LAB — COVID-19, FLU A+B NAA
Influenza A, NAA: NOT DETECTED
Influenza B, NAA: NOT DETECTED
SARS-CoV-2, NAA: NOT DETECTED

## 2021-12-11 DIAGNOSIS — J039 Acute tonsillitis, unspecified: Secondary | ICD-10-CM | POA: Diagnosis not present

## 2021-12-11 DIAGNOSIS — Z20822 Contact with and (suspected) exposure to covid-19: Secondary | ICD-10-CM | POA: Diagnosis not present

## 2021-12-11 DIAGNOSIS — R070 Pain in throat: Secondary | ICD-10-CM | POA: Diagnosis not present

## 2021-12-11 DIAGNOSIS — R059 Cough, unspecified: Secondary | ICD-10-CM | POA: Diagnosis not present

## 2022-07-02 DIAGNOSIS — Z01419 Encounter for gynecological examination (general) (routine) without abnormal findings: Secondary | ICD-10-CM | POA: Diagnosis not present

## 2022-07-16 ENCOUNTER — Encounter: Payer: Self-pay | Admitting: Primary Care

## 2022-07-16 ENCOUNTER — Ambulatory Visit (INDEPENDENT_AMBULATORY_CARE_PROVIDER_SITE_OTHER): Payer: BC Managed Care – PPO | Admitting: Primary Care

## 2022-07-16 VITALS — BP 122/80 | HR 69 | Temp 98.4°F | Ht 68.0 in | Wt 167.0 lb

## 2022-07-16 DIAGNOSIS — R002 Palpitations: Secondary | ICD-10-CM | POA: Diagnosis not present

## 2022-07-16 DIAGNOSIS — F411 Generalized anxiety disorder: Secondary | ICD-10-CM

## 2022-07-16 MED ORDER — SERTRALINE HCL 25 MG PO TABS
25.0000 mg | ORAL_TABLET | Freq: Every day | ORAL | 0 refills | Status: DC
Start: 2022-07-16 — End: 2022-10-21

## 2022-07-16 NOTE — Addendum Note (Signed)
Addended by: Donnamarie Poag on: 07/16/2022 09:30 AM   Modules accepted: Orders

## 2022-07-16 NOTE — Assessment & Plan Note (Signed)
Chronic, uncontrolled.  Do suspect palpitations are secondary to anxiety, or at least partially secondary to anxiety. Will rule out other causes for palpitations.  Meanwhile, we discussed treatment for anxiety, she opts for both medication and therapy. Referral placed for therapy.  Start sertraline 25 mg daily.  We discussed possible side effects of headache, GI upset, drowsiness, and SI/HI. If thoughts of SI/HI develop, we discussed to present to the emergency immediately. Patient verbalized understanding.   Follow up in 6 weeks for re-evaluation.

## 2022-07-16 NOTE — Assessment & Plan Note (Addendum)
Likely secondary to anxiety, but need to rule out other causes.   Labs pending today including CBC, TSH, CMP. ECG today with sinus bradycardia with rate of 57. No PAC/PVCs. No prior ECG to compare.   Referral placed to cardiology for further evaluation. Will work to treat anxiety.  Follow up in 6 weeks.

## 2022-07-16 NOTE — Patient Instructions (Signed)
Start sertraline (Zoloft) 25 mg for anxiety. Take 1 tablet by mouth everyday.  You will either be contacted via phone regarding your referral to therapy/cardiology, or you may receive a letter on your MyChart portal from our referral team with instructions for scheduling an appointment. Please let us know if you have not been contacted by anyone within two weeks.  Please schedule a follow up visit for 6 weeks for follow up of anxiety.  It was a pleasure to see you today!

## 2022-07-16 NOTE — Progress Notes (Signed)
Subjective:    Patient ID: Glenda Morrison, female    DOB: [birth year], 26 y.o.   MRN: [id number]  HPI  Glenda Morrison is a very pleasant 26 y.o. female patient of Deboraha Sprang with a history of GERD, pyelonephritis who presents today to discuss palpitations.   Symptom onset one month ago with symptoms of intermittent palpitations occurring throughout the day. She will feel overwhelmed and feel her heart "beating outside of my chest". She's also noticed episodes of tachycardia with skipped beats intermittently. She was drinking excessive amounts of coffee so she cut back, symptoms continued.   She wears a smart watch, has monitored her rate, doesn't recall readings. She underwent an upper endoscopy years ago which showed stress induced reflux.   Chronic history of anxiety that dates back years. She is post partum as of March 2022, and since then her anxiety has increased. She did experience post partum depression, was never treated, overall feels better now. Symptoms of anxiety include thinking worst case scenario, constant worrying, feeling over whelmed.   She has never been treated for her anxiety. She is currently breastfeeding once daily. She has a family history of anxiety, denies family history of heart disease.    Review of Systems  Constitutional:  Negative for fatigue.  Respiratory:  Negative for shortness of breath.   Cardiovascular:  Positive for palpitations. Negative for chest pain.  Neurological:  Negative for dizziness.  Psychiatric/Behavioral:  The patient is nervous/anxious.          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    Social History   Socioeconomic History   Marital status: Married    Spouse name: Not on file   Number of children:  Not on file   Years of education: Not on file   Highest education level: Not on file  Occupational History    Comment: Garden Designer, television/film set  Tobacco Use   Smoking status: Never   Smokeless tobacco: Never  Vaping Use   Vaping Use: Never used  Substance and Sexual Activity   Alcohol use: Not Currently    Alcohol/week: 0.0 standard drinks of alcohol    Comment: Socially   Drug use: No   Sexual activity: Yes  Other Topics Concern   Not on file  Social History Narrative   Very artistic- wants to go into Estate agent or Diplomatic Services operational officer from high school   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   [employer] about college   [employer]   Social Determinants of Corporate investment banker Strain: Not on file  Food Insecurity: Not on file  Transportation Needs: Not on file  Physical Activity: Not on file  Stress: Not on file  Social Connections: Not on file  Intimate Partner Violence: Not on file    Past Surgical History:  Procedure Laterality Date   UPPER GI ENDOSCOPY     WISDOM TOOTH EXTRACTION      Family History  Problem Relation Age of Onset   Healthy Mother    Arthritis Father    Stroke Father    Pancreatic cancer Maternal Uncle    Stroke Paternal Uncle    Arthritis Maternal Grandmother    Esophageal cancer Maternal Grandmother    Cancer Maternal Grandmother  Stomach   Pancreatic cancer Maternal Grandfather    Alcohol abuse Paternal Grandfather    Breast cancer Other        great grandmother    No Known Allergies  Current Outpatient Medications on File Prior to Visit  Medication Sig Dispense Refill   albuterol (PROVENTIL HFA;VENTOLIN HFA) 108 (90 Base) MCG/ACT inhaler Inhale 1-2 puffs into the lungs every 6 (six) hours as needed for wheezing or shortness of breath.     diphenhydrAMINE (BENADRYL) 25 MG tablet Take 25 mg by mouth at bedtime as needed for sleep.     Prenatal Vit-Fe Fumarate-FA (PRENATAL MULTIVITAMIN) TABS tablet Take 1 tablet by mouth  at bedtime.     No current facility-administered medications on file prior to visit.    BP 122/80   Pulse 69   Temp 98.4 F (36.9 C) (Temporal)   Ht 5\' 8"  (1.727 m)   Wt 167 lb (75.8 kg)   LMP 06/20/2022   SpO2 99%   Breastfeeding Yes   BMI 25.39 kg/m  Objective:   Physical Exam Cardiovascular:     Rate and Rhythm: Normal rate and regular rhythm.  Pulmonary:     Effort: Pulmonary effort is normal.     Breath sounds: Normal breath sounds.  Musculoskeletal:     Cervical back: Neck supple.  Skin:    General: Skin is warm and dry.  Neurological:     Mental Status: She is alert.  Psychiatric:        Mood and Affect: Mood normal.           Assessment & Plan:   Problem List Items Addressed This Visit       Other   Palpitations - Primary    Likely secondary to anxiety, but need to rule out other causes.   Labs pending today including CBC, TSH, CMP. ECG today with sinus bradycardia with rate of 57. No PAC/PVCs. No prior ECG to compare.   Referral placed to cardiology for further evaluation. Will work to treat anxiety.  Follow up in 6 weeks.      Relevant Orders   Ambulatory referral to Cardiology   CBC   TSH   Comprehensive metabolic panel   EKG 12-Lead (Completed)   GAD (generalized anxiety disorder)    Chronic, uncontrolled.  Do suspect palpitations are secondary to anxiety, or at least partially secondary to anxiety. Will rule out other causes for palpitations.  Meanwhile, we discussed treatment for anxiety, she opts for both medication and therapy. Referral placed for therapy.  Start sertraline 25 mg daily.  We discussed possible side effects of headache, GI upset, drowsiness, and SI/HI. If thoughts of SI/HI develop, we discussed to present to the emergency immediately. Patient verbalized understanding.   Follow up in 6 weeks for re-evaluation.        Relevant Medications   sertraline (ZOLOFT) 25 MG tablet   Other Relevant Orders    Ambulatory referral to Psychology       06/22/2022, NP

## 2022-07-17 ENCOUNTER — Other Ambulatory Visit (INDEPENDENT_AMBULATORY_CARE_PROVIDER_SITE_OTHER): Payer: BC Managed Care – PPO

## 2022-07-17 DIAGNOSIS — R002 Palpitations: Secondary | ICD-10-CM

## 2022-07-17 LAB — COMPREHENSIVE METABOLIC PANEL
ALT: 17 U/L (ref 0–35)
AST: 21 U/L (ref 0–37)
Albumin: 4.3 g/dL (ref 3.5–5.2)
Alkaline Phosphatase: 55 U/L (ref 39–117)
BUN: 12 mg/dL (ref 6–23)
CO2: 25 mEq/L (ref 19–32)
Calcium: 9 mg/dL (ref 8.4–10.5)
Chloride: 102 mEq/L (ref 96–112)
Creatinine, Ser: 0.68 mg/dL (ref 0.40–1.20)
GFR: 119.55 mL/min (ref >60.00)
Glucose, Bld: 88 mg/dL (ref 70–99)
Potassium: 4 mEq/L (ref 3.5–5.1)
Sodium: 134 mEq/L — ABNORMAL LOW (ref 135–145)
Total Bilirubin: 0.6 mg/dL (ref 0.2–1.2)
Total Protein: 7 g/dL (ref 6.0–8.3)

## 2022-07-17 LAB — CBC
HCT: 37.9 % (ref 36.0–46.0)
Hemoglobin: 12.9 g/dL (ref 12.0–15.0)
MCHC: 34.1 g/dL (ref 30.0–36.0)
MCV: 82.5 fl (ref 78.0–100.0)
Platelets: 223 10*3/uL (ref 150.0–400.0)
RBC: 4.6 Mil/uL (ref 3.87–5.11)
RDW: 13.3 % (ref 11.5–15.5)
WBC: 4.6 10*3/uL (ref 4.0–10.5)

## 2022-07-17 LAB — TSH: TSH: 1.73 u[IU]/mL (ref 0.35–5.50)

## 2022-08-28 ENCOUNTER — Ambulatory Visit (INDEPENDENT_AMBULATORY_CARE_PROVIDER_SITE_OTHER): Payer: BC Managed Care – PPO | Admitting: Primary Care

## 2022-08-28 ENCOUNTER — Encounter: Payer: Self-pay | Admitting: Primary Care

## 2022-08-28 VITALS — BP 122/64 | HR 65 | Temp 98.8°F | Ht 68.0 in | Wt 173.0 lb

## 2022-08-28 DIAGNOSIS — F411 Generalized anxiety disorder: Secondary | ICD-10-CM | POA: Diagnosis not present

## 2022-08-28 DIAGNOSIS — R002 Palpitations: Secondary | ICD-10-CM

## 2022-08-28 NOTE — Assessment & Plan Note (Signed)
Improved!  Offered to increase Zoloft dose to 50 mg, but she is content with her current dose. She will also start therapy later this week.   Continue Zoloft 25 mg daily. Continue to monitor.

## 2022-08-28 NOTE — Patient Instructions (Signed)
It was a pleasure to see you today!   

## 2022-08-28 NOTE — Assessment & Plan Note (Signed)
Nearly resolved. Likely anxiety driven.  Labs and ECG from last visit negative.

## 2022-08-28 NOTE — Progress Notes (Signed)
Subjective:    Patient ID: Glenda Morrison, female    DOB: 12/18/1995, 27 y.o.   MRN: [id number]  HPI  Glenda Morrison is a very pleasant 27 y.o. female who presents today for follow up of anxiety/depression.  She was last evaluated on 07/16/22 for a one month history of intermittent palpitations with preceding feelings of feeling overwhelmed. She has a chronic history of anxiety that had increased. She is post partum as of March 2022. During this visit we referred her to therapy and initiated Zoloft 25 mg daily. She underwent ECG and labs which were grossly benign.   Since her last visit she's feeling better. Her palpitations have significantly reduced. She continues to notice her anxiety symptoms which include worrying, over thinking things. She feels content on her current dose of Zoloft 25 mg. Is now pregnant.   She has her first therapy session scheduled for this week. She denies SI/HI, nausea, headaches.    Review of Systems  Gastrointestinal:  Negative for nausea.  Neurological:  Negative for headaches.  Psychiatric/Behavioral:  Negative for suicidal ideas. The patient is nervous/anxious.          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    Social History   Socioeconomic History   Marital status: Married    Spouse name: Not on file   Number of children: Not on file   Years of education: Not on file   Highest education level: Not on file  Occupational History    Comment: Garden Veterinary surgeon  Tobacco Use   Smoking status: Never   Smokeless tobacco: Never  Vaping Use   Vaping Use: Never used  Substance and Sexual Activity   Alcohol use: Not Currently    Alcohol/week: 0.0 standard drinks of alcohol    Comment: Socially   Drug use: No   Sexual  activity: Yes  Other Topics Concern   Not on file  Social History Narrative   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 from high school   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   [employer] about college   [employer]   Social Determinants of Radio broadcast assistant Strain: Not on file  Food Insecurity: Not on file  Transportation Needs: Not on file  Physical Activity: Not on file  Stress: Not on file  Social Connections: Not on file  Intimate Partner Violence: Not on file    Past Surgical History:  Procedure Laterality Date   UPPER GI ENDOSCOPY     WISDOM TOOTH EXTRACTION      Family History  Problem Relation Age of Onset   Healthy Mother    Arthritis Father    Stroke Father    Pancreatic cancer Maternal Uncle    Stroke Paternal Uncle    Arthritis Maternal Grandmother    Esophageal cancer Maternal Grandmother    Cancer Maternal Grandmother        Stomach   Pancreatic cancer Maternal Grandfather    Alcohol abuse Paternal Grandfather    Breast cancer Other        great grandmother    No Known Allergies  Current Outpatient Medications on File Prior to Visit  Medication Sig Dispense Refill   diphenhydrAMINE (BENADRYL) 25 MG tablet Take 25  mg by mouth at bedtime as needed for sleep.     Prenatal Vit-Fe Fumarate-FA (PRENATAL MULTIVITAMIN) TABS tablet Take 1 tablet by mouth at bedtime.     sertraline (ZOLOFT) 25 MG tablet Take 1 tablet (25 mg total) by mouth daily. For anxiety 90 tablet 0   albuterol (PROVENTIL HFA;VENTOLIN HFA) 108 (90 Base) MCG/ACT inhaler Inhale 1-2 puffs into the lungs every 6 (six) hours as needed for wheezing or shortness of breath. (Patient not taking: Reported on 08/28/2022)     No current facility-administered medications on file prior to visit.    BP 122/64   Pulse 65   Temp 98.8 F (37.1 C) (Temporal)   Ht 5\' 8"  (1.727 m)   Wt 173 lb (78.5 kg)   SpO2 99%   BMI 26.30 kg/m  Objective:   Physical  Exam Cardiovascular:     Rate and Rhythm: Normal rate and regular rhythm.  Pulmonary:     Effort: Pulmonary effort is normal.     Breath sounds: Normal breath sounds.  Musculoskeletal:     Cervical back: Neck supple.  Skin:    General: Skin is warm and dry.  Psychiatric:        Mood and Affect: Mood normal.           Assessment & Plan:   Problem List Items Addressed This Visit       Other   Palpitations - Primary    Nearly resolved. Likely anxiety driven.  Labs and ECG from last visit negative.      GAD (generalized anxiety disorder)    Improved!  Offered to increase Zoloft dose to 50 mg, but she is content with her current dose. She will also start therapy later this week.   Continue Zoloft 25 mg daily. Continue to monitor.           Pleas Koch, NP

## 2022-08-30 ENCOUNTER — Ambulatory Visit (INDEPENDENT_AMBULATORY_CARE_PROVIDER_SITE_OTHER): Payer: BC Managed Care – PPO | Admitting: Clinical

## 2022-08-30 DIAGNOSIS — F411 Generalized anxiety disorder: Secondary | ICD-10-CM
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
## 2022-09-13 ENCOUNTER — Ambulatory Visit: Payer: BC Managed Care – PPO | Admitting: Internal Medicine

## 2022-09-19 DIAGNOSIS — Z6826 Body mass index (BMI) 26.0-26.9, adult: Secondary | ICD-10-CM | POA: Diagnosis not present

## 2022-09-19 DIAGNOSIS — Z3201 Encounter for pregnancy test, result positive: Secondary | ICD-10-CM | POA: Diagnosis not present

## 2022-09-19 DIAGNOSIS — N925 Other specified irregular menstruation: Secondary | ICD-10-CM | POA: Diagnosis not present

## 2022-09-19 DIAGNOSIS — O021 Missed abortion: Secondary | ICD-10-CM | POA: Diagnosis not present

## 2022-09-21 DIAGNOSIS — O360990 Maternal care for other rhesus isoimmunization, unspecified trimester, not applicable or unspecified: Secondary | ICD-10-CM | POA: Diagnosis not present

## 2022-09-21 DIAGNOSIS — Z6791 Unspecified blood type, Rh negative: Secondary | ICD-10-CM | POA: Diagnosis not present

## 2022-09-25 ENCOUNTER — Ambulatory Visit: Payer: BC Managed Care – PPO | Admitting: Clinical

## 2022-09-26 ENCOUNTER — Encounter (HOSPITAL_COMMUNITY): Payer: Self-pay

## 2022-09-26 ENCOUNTER — Inpatient Hospital Stay (HOSPITAL_COMMUNITY)
Admission: AD | Admit: 2022-09-26 | Discharge: 2022-09-26 | Disposition: A | Discharge status: Home / Self Care | Payer: BC Managed Care – PPO | Attending: Obstetrics and Gynecology | Admitting: Obstetrics and Gynecology

## 2022-09-26 ENCOUNTER — Inpatient Hospital Stay (HOSPITAL_COMMUNITY): Payer: BC Managed Care – PPO

## 2022-09-26 DIAGNOSIS — O034 Incomplete spontaneous abortion without complication: Secondary | ICD-10-CM

## 2022-09-26 DIAGNOSIS — U071 COVID-19: Secondary | ICD-10-CM | POA: Insufficient documentation

## 2022-09-26 DIAGNOSIS — O031 Delayed or excessive hemorrhage following incomplete spontaneous abortion: Secondary | ICD-10-CM | POA: Diagnosis not present

## 2022-09-26 DIAGNOSIS — Z3A01 Less than 8 weeks gestation of pregnancy: Secondary | ICD-10-CM

## 2022-09-26 DIAGNOSIS — O209 Hemorrhage in early pregnancy, unspecified: Secondary | ICD-10-CM | POA: Diagnosis not present

## 2022-09-26 LAB — URINALYSIS, ROUTINE W REFLEX MICROSCOPIC
Bilirubin Urine: NEGATIVE
Glucose, UA: NEGATIVE mg/dL
Ketones, ur: NEGATIVE mg/dL
Leukocytes,Ua: NEGATIVE
Nitrite: NEGATIVE
Protein, ur: NEGATIVE mg/dL
RBC / HPF: >50 RBC/hpf (ref 0–5)
Specific Gravity, Urine: 1.009 (ref 1.005–1.030)
pH: 8 (ref 5.0–8.0)

## 2022-09-26 LAB — HCG, QUANTITATIVE, PREGNANCY: hCG, Beta Chain, Quant, S: 4918 m[IU]/mL — ABNORMAL HIGH (ref <5)

## 2022-09-26 LAB — HEMOGLOBIN AND HEMATOCRIT, BLOOD
HCT: 33.3 % — ABNORMAL LOW (ref 36.0–46.0)
Hemoglobin: 11.1 g/dL — ABNORMAL LOW (ref 12.0–15.0)

## 2022-09-26 MED ORDER — LIDOCAINE-EPINEPHRINE 1 %-1:100000 IJ SOLN
10.0000 mL | Freq: Once | INTRAMUSCULAR | Status: AC
  Administered 2022-09-26: 10 mL
  Filled 2022-09-26 (×2): qty 10

## 2022-09-26 MED ORDER — KETOROLAC TROMETHAMINE 30 MG/ML IJ SOLN: 30.0000 mg | Freq: Once | INTRAMUSCULAR | Status: DC

## 2022-09-26 MED ORDER — DOXYCYCLINE HYCLATE 100 MG PO CAPS: 200.0000 mg | ORAL_CAPSULE | Freq: Every day | ORAL | 0 refills | Status: AC

## 2022-09-26 MED ORDER — FENTANYL CITRATE (PF) 100 MCG/2ML IJ SOLN
100.0000 ug | Freq: Once | INTRAMUSCULAR | Status: AC
  Administered 2022-09-26: 100 ug via INTRAVENOUS
  Filled 2022-09-26: qty 2

## 2022-09-26 MED ORDER — ONDANSETRON 4 MG PO TBDP
4.0000 mg | ORAL_TABLET | Freq: Once | ORAL | Status: AC
  Administered 2022-09-26: 4 mg via ORAL
  Filled 2022-09-26: qty 1

## 2022-09-26 MED ORDER — KETOROLAC TROMETHAMINE 30 MG/ML IJ SOLN
30.0000 mg | Freq: Once | INTRAMUSCULAR | Status: AC
  Administered 2022-09-26: 30 mg via INTRAVENOUS
  Filled 2022-09-26: qty 1

## 2022-09-26 NOTE — Procedures (Signed)
MANUAL UTERINE ASPIRATION  Patient consented and ID verified. Patient placed in dorsal lithotomy. IV fentanyl 188mcg plus IV toradol 30mg  administered after consent. Speculum placed, cervix cleaned with betadine. External os 1cm dilated, scant amount of POC noted, removed with ring forceps and placed in specimen cup. BSUS used for guidance. Paracervical block with 7cc 1% lidocaine with epi placed without issue. Size 7 suction used from MUA kit, significant tissue removed with one pass, good contraction of uterus noted, thin EMS on BSUS. Minimal bleeding after bimanual massage.  Total EBL 100cc  PO doxy sent home to 24[facility] pharmacy. Will flag clinica for phone f/u on Friday and in-office f/u as dictated. Starting beta today 4918.
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
## 2022-09-26 NOTE — MAU Note (Signed)
..  Glenda Morrison is a 27 y.o. at Unknown here in MAU reporting: heavy bleeding, sever cramping, and dizziness since miscarriage 09/19/2022 and taking Cytotec last Friday. Also recent + Covid (home test) on 09/23/2022.  Onset of complaint: 09/25/2022 Pain score: 2/10 Vitals:   09/26/22 1553  BP: 132/74  Pulse: 84  Resp: 18  Temp: 98.5 F (36.9 C)  SpO2: 100%      Lab orders placed from triage: UA

## 2022-09-28 LAB — SURGICAL PATHOLOGY

## 2022-10-14 ENCOUNTER — Other Ambulatory Visit: Payer: Self-pay | Admitting: Primary Care

## 2022-10-14 DIAGNOSIS — J4520 Mild intermittent asthma, uncomplicated: Secondary | ICD-10-CM

## 2022-10-14 MED ORDER — ALBUTEROL SULFATE HFA 108 (90 BASE) MCG/ACT IN AERS: 1.0000 | INHALATION_SPRAY | Freq: Four times a day (QID) | RESPIRATORY_TRACT | 0 refills | Status: DC | PRN

## 2022-10-14 NOTE — Telephone Encounter (Signed)
From: Anderson Malta To: Office of Pleas Koch, NP Sent: 10/13/2022 4:13 PM EST Subject: Medication Renewal Request  Refills have been requested for the following medications:   albuterol (PROVENTIL HFA;VENTOLIN HFA) 108 (90 Base) MCG/ACT inhaler  Preferred pharmacy: [facility] [place], [place] Delivery method: Pickup

## 2022-10-21 ENCOUNTER — Other Ambulatory Visit: Payer: Self-pay | Admitting: Primary Care

## 2022-10-21 DIAGNOSIS — F411 Generalized anxiety disorder: Secondary | ICD-10-CM

## 2022-11-07 ENCOUNTER — Other Ambulatory Visit: Payer: Self-pay

## 2022-11-07 ENCOUNTER — Inpatient Hospital Stay (HOSPITAL_COMMUNITY)
Admission: AD | Admit: 2022-11-07 | Discharge: 2022-11-08 | Disposition: A | Discharge status: Home / Self Care | Payer: BC Managed Care – PPO | Attending: Obstetrics and Gynecology | Admitting: Obstetrics and Gynecology

## 2022-11-07 DIAGNOSIS — R7989 Other specified abnormal findings of blood chemistry: Secondary | ICD-10-CM | POA: Diagnosis present

## 2022-11-07 DIAGNOSIS — Z3202 Encounter for pregnancy test, result negative: Secondary | ICD-10-CM | POA: Insufficient documentation

## 2022-11-07 DIAGNOSIS — N939 Abnormal uterine and vaginal bleeding, unspecified: Secondary | ICD-10-CM | POA: Insufficient documentation
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
## 2022-11-08 DIAGNOSIS — N939 Abnormal uterine and vaginal bleeding, unspecified: Secondary | ICD-10-CM | POA: Diagnosis not present

## 2022-11-08 DIAGNOSIS — Z3202 Encounter for pregnancy test, result negative: Secondary | ICD-10-CM

## 2022-11-08 DIAGNOSIS — R7989 Other specified abnormal findings of blood chemistry: Secondary | ICD-10-CM | POA: Diagnosis present

## 2022-11-08 LAB — CBC
HCT: 34 % — ABNORMAL LOW (ref 36.0–46.0)
Hemoglobin: 11.4 g/dL — ABNORMAL LOW (ref 12.0–15.0)
MCH: 27.3 pg (ref 26.0–34.0)
MCHC: 33.5 g/dL (ref 30.0–36.0)
MCV: 81.5 fL (ref 80.0–100.0)
Platelets: 246 10*3/uL (ref 150–400)
RBC: 4.17 MIL/uL (ref 3.87–5.11)
RDW: 12.1 % (ref 11.5–15.5)
WBC: 6 10*3/uL (ref 4.0–10.5)
nRBC: 0 % (ref 0.0–0.2)

## 2022-11-08 LAB — HCG, QUANTITATIVE, PREGNANCY: hCG, Beta Chain, Quant, S: 11 m[IU]/mL — ABNORMAL HIGH (ref <5)

## 2022-11-08 LAB — POCT PREGNANCY, URINE: Preg Test, Ur: NEGATIVE

## 2022-11-08 NOTE — Progress Notes (Signed)
Pt expressed concern to RN about going home - Hansel Feinstein, CNM made aware and discussed concerns with pt. Pt offered to stay to wait on lab results or to go home - pt requesting to speak to husband before making decision. RN left pt and husband alone to talk. RN called back into triage room at 0058 by pt - pt states she wants to go home. Verbal and written discharge instructions given to pt and husband. Pt verbalized understanding.

## 2022-11-08 NOTE — MAU Provider Note (Addendum)
Event Date/Time   First Provider Initiated Contact with Patient 11/08/22 0006      S Ms. Glenda Morrison is a 27 y.o. G2P1011 patient who presents to MAU today with complaint of vaginal bleeding. Patient states she had a positive UPT 4 days ago and had heavy vaginal bleeding tonight. She denies any pain.    O BP 125/77 (BP Location: Right Arm)   Pulse 85   Temp 98.8 F (37.1 C) (Oral)   Resp 20   Ht '5\' 8"'$  (1.727 m)   Wt 82.5 kg   BMI 27.64 kg/m  Physical Exam Vitals and nursing note reviewed.  Constitutional:      General: She is not in acute distress.    Appearance: She is well-developed.  HENT:     Head: Normocephalic.  Eyes:     Pupils: Pupils are equal, round, and reactive to light.  Cardiovascular:     Rate and Rhythm: Normal rate and regular rhythm.     Heart sounds: Normal heart sounds.  Pulmonary:     Effort: Pulmonary effort is normal. No respiratory distress.     Breath sounds: Normal breath sounds.  Abdominal:     General: Bowel sounds are normal. There is no distension.     Palpations: Abdomen is soft.     Tenderness: There is no abdominal tenderness.  Skin:    General: Skin is warm and dry.  Neurological:     Mental Status: She is alert and oriented to person, place, and time.  Psychiatric:        Mood and Affect: Mood normal.        Behavior: Behavior normal.        Thought Content: Thought content normal.        Judgment: Judgment normal.     A Medical screening exam complete 1. Negative pregnancy test   2. Vaginal bleeding    -Discussed negative UPT in MAU and offered HCG. Patient agreeable. Will call patient with results and discussed if positive, will need repeat HCG on Saturday.   Care turned over to [facility]. Jlee Harkless CNM at [place], [place] 11/08/22  P -Discharge home in stable condition -Vaginal beleding precautions discussed -Patient may return to MAU as needed or if her condition were to change or worsen   Results for  orders placed or performed during the hospital encounter of 11/07/22 (from the past 24 hour(s))  Pregnancy, urine POC     Status: None   Collection Time: 11/08/22 12:03 AM  Result Value Ref Range   Preg Test, Ur NEGATIVE NEGATIVE  hCG, quantitative, pregnancy     Status: Abnormal   Collection Time: 11/08/22 12:28 AM  Result Value Ref Range   hCG, Beta Chain, Quant, S 11 (H) <5 mIU/mL  CBC     Status: Abnormal   Collection Time: 11/08/22 12:28 AM  Result Value Ref Range   WBC 6.0 4.0 - 10.5 K/uL   RBC 4.17 3.87 - 5.11 MIL/uL   Hemoglobin 11.4 (L) 12.0 - 15.0 g/dL   HCT 34.0 (L) 36.0 - 46.0 %   MCV 81.5 80.0 - 100.0 fL   MCH 27.3 26.0 - 34.0 pg   MCHC 33.5 30.0 - 36.0 g/dL   RDW 12.1 11.5 - 15.5 %   Platelets 246 150 - 400 K/uL   nRBC 0.0 0.0 - 0.2 %   DIscussed resulrs with patient Recommend repeat HCG on Saturday to see which direction it goes  Discussed Hemoglobin is  stable from a month ago, so blood loss so far is not worrisome.  Encouraged to return if she develops worsening of symptoms, increase in pain, fever, or other concerning symptoms.   Seabron Spates, CNM

## 2022-11-10 ENCOUNTER — Encounter (HOSPITAL_COMMUNITY): Payer: Self-pay | Admitting: *Deleted

## 2022-11-10 ENCOUNTER — Inpatient Hospital Stay (HOSPITAL_COMMUNITY)
Admission: AD | Admit: 2022-11-10 | Discharge: 2022-11-10 | Disposition: A | Discharge status: Home / Self Care | Payer: BC Managed Care – PPO | Attending: Obstetrics and Gynecology | Admitting: Obstetrics and Gynecology

## 2022-11-10 DIAGNOSIS — Z3A00 Weeks of gestation of pregnancy not specified: Secondary | ICD-10-CM

## 2022-11-10 DIAGNOSIS — Z362 Encounter for other antenatal screening follow-up: Secondary | ICD-10-CM | POA: Diagnosis not present

## 2022-11-10 DIAGNOSIS — O039 Complete or unspecified spontaneous abortion without complication: Secondary | ICD-10-CM | POA: Diagnosis not present

## 2022-11-10 DIAGNOSIS — O3680X0 Pregnancy with inconclusive fetal viability, not applicable or unspecified: Secondary | ICD-10-CM | POA: Diagnosis not present

## 2022-11-10 DIAGNOSIS — Z3490 Encounter for supervision of normal pregnancy, unspecified, unspecified trimester: Secondary | ICD-10-CM

## 2022-11-10 LAB — HCG, QUANTITATIVE, PREGNANCY: hCG, Beta Chain, Quant, S: 5 m[IU]/mL — ABNORMAL HIGH (ref <5)

## 2022-11-10 NOTE — MAU Note (Signed)
Glenda Morrison is a 27 y.o. at Unknown here in MAU reporting:  here for repeat blood work, is a little nervous.  still having some cramping.  Only having brown spotting now.   Onset of complaint: ongoing-started Wed night Pain score: 3 Vitals:   11/10/22 1258  BP: 125/67  Pulse: 76  Resp: 18  Temp: 98.4 F (36.9 C)  SpO2: 100%      Lab orders placed from triage:  blood work has been drawn.

## 2022-11-16 DIAGNOSIS — Z8751 Personal history of pre-term labor: Secondary | ICD-10-CM | POA: Diagnosis not present

## 2022-11-16 DIAGNOSIS — N939 Abnormal uterine and vaginal bleeding, unspecified: Secondary | ICD-10-CM | POA: Diagnosis not present

## 2023-01-03 DIAGNOSIS — N939 Abnormal uterine and vaginal bleeding, unspecified: Secondary | ICD-10-CM | POA: Diagnosis not present

## 2023-02-08 DIAGNOSIS — N939 Abnormal uterine and vaginal bleeding, unspecified: Secondary | ICD-10-CM | POA: Diagnosis not present

## 2023-05-09 ENCOUNTER — Ambulatory Visit: Payer: BC Managed Care – PPO | Admitting: Primary Care

## 2023-05-30 ENCOUNTER — Ambulatory Visit: Payer: BC Managed Care – PPO | Admitting: Primary Care

## 2023-07-07 NOTE — Progress Notes (Unsigned)
    Glenda Morrison T. Glenda Armendariz, MD, CAQ Sports Medicine [facility] at [facility] [address] [place], [address]  Phone: [phone]  FAX: [phone]  Glenda Morrison - 27 y.o. female  MRN [id number]  Date of Birth: [birth year]  Date: 07/08/2023  PCP: Doreene Nest, NP  Referral: Doreene Nest, NP  No chief complaint on file.  Subjective:   Glenda Morrison is a 27 y.o. very pleasant female patient with There is no height or weight on file to calculate BMI. who presents with the following:  She is a very pleasant young lady who presents with some ongoing knee pain.  She is a new patient to me.    Review of Systems is noted in the HPI, as appropriate  Objective:   LMP 06/20/2022   GEN: No acute distress; alert,appropriate. PULM: Breathing comfortably in no respiratory distress PSYCH: Normally interactive.   Laboratory and Imaging Data:  Assessment and Plan:   ***

## 2023-07-08 ENCOUNTER — Ambulatory Visit
Admission: RE | Admit: 2023-07-08 | Discharge: 2023-07-08 | Disposition: A | Discharge status: Home / Self Care | Payer: BC Managed Care – PPO | Source: Ambulatory Visit | Attending: Family Medicine | Admitting: Family Medicine

## 2023-07-08 ENCOUNTER — Ambulatory Visit: Payer: BC Managed Care – PPO | Admitting: Family Medicine

## 2023-07-08 ENCOUNTER — Encounter: Payer: Self-pay | Admitting: Family Medicine

## 2023-07-08 VITALS — BP 90/60 | HR 65 | Temp 98.4°F | Ht 68.0 in | Wt 180.4 lb

## 2023-07-08 DIAGNOSIS — G8929 Other chronic pain: Secondary | ICD-10-CM

## 2023-07-08 DIAGNOSIS — M25561 Pain in right knee: Secondary | ICD-10-CM

## 2023-07-08 LAB — POCT URINE PREGNANCY: Preg Test, Ur: NEGATIVE

## 2023-07-08 MED ORDER — CELECOXIB 200 MG PO CAPS: 200.0000 mg | ORAL_CAPSULE | Freq: Every day | ORAL | 2 refills | Status: DC

## 2023-07-08 NOTE — Patient Instructions (Signed)
Treadmill: Calf and posterior lower extremity rehab  Forward Walking: Go light 2 mins, easy about 3 mph: At Sideways Left: 2 mins, 0.6 - 0.8 mph Sideways Right: 2 mins, 0.6 - 0.8 mph Backwards, 2 mins, 1.8 - 2.2 mph Repeat, several cycles Goal is 30 minute  Start at a 3 degree incline - can slightly lower if necessary When able, increase to 3.5, then 4, then 4.5 (After you comfortably can do 30 minutes)

## 2023-08-12 DIAGNOSIS — Z3201 Encounter for pregnancy test, result positive: Secondary | ICD-10-CM | POA: Diagnosis not present

## 2023-08-14 DIAGNOSIS — O021 Missed abortion: Secondary | ICD-10-CM | POA: Diagnosis not present

## 2023-08-29 ENCOUNTER — Ambulatory Visit: Payer: BC Managed Care – PPO

## 2023-09-11 DIAGNOSIS — Z124 Encounter for screening for malignant neoplasm of cervix: Secondary | ICD-10-CM | POA: Diagnosis not present

## 2023-09-11 DIAGNOSIS — Z3480 Encounter for supervision of other normal pregnancy, unspecified trimester: Secondary | ICD-10-CM | POA: Diagnosis not present

## 2023-09-11 DIAGNOSIS — Z113 Encounter for screening for infections with a predominantly sexual mode of transmission: Secondary | ICD-10-CM | POA: Diagnosis not present

## 2023-09-11 DIAGNOSIS — N925 Other specified irregular menstruation: Secondary | ICD-10-CM | POA: Diagnosis not present

## 2023-09-11 LAB — OB RESULTS CONSOLE RUBELLA ANTIBODY, IGM: Rubella: IMMUNE

## 2023-09-11 LAB — OB RESULTS CONSOLE HIV ANTIBODY (ROUTINE TESTING): HIV: NONREACTIVE

## 2023-09-11 LAB — OB RESULTS CONSOLE GC/CHLAMYDIA
Chlamydia: NEGATIVE
Neisseria Gonorrhea: NEGATIVE

## 2023-09-11 LAB — OB RESULTS CONSOLE ANTIBODY SCREEN: Antibody Screen: NEGATIVE

## 2023-09-11 LAB — HEPATITIS C ANTIBODY: HCV Ab: NEGATIVE

## 2023-09-11 LAB — OB RESULTS CONSOLE RPR: RPR: NONREACTIVE

## 2023-09-11 LAB — OB RESULTS CONSOLE HEPATITIS B SURFACE ANTIGEN: Hepatitis B Surface Ag: NEGATIVE

## 2023-09-18 ENCOUNTER — Encounter: Payer: Self-pay | Admitting: Primary Care

## 2023-09-18 ENCOUNTER — Ambulatory Visit: Payer: BC Managed Care – PPO | Admitting: Primary Care

## 2023-09-18 VITALS — BP 128/72 | HR 99 | Temp 97.6°F | Ht 68.0 in | Wt 177.0 lb

## 2023-09-18 DIAGNOSIS — J4520 Mild intermittent asthma, uncomplicated: Secondary | ICD-10-CM | POA: Diagnosis not present

## 2023-09-18 DIAGNOSIS — J4541 Moderate persistent asthma with (acute) exacerbation: Secondary | ICD-10-CM

## 2023-09-18 DIAGNOSIS — J4531 Mild persistent asthma with (acute) exacerbation: Secondary | ICD-10-CM | POA: Insufficient documentation

## 2023-09-18 MED ORDER — PREDNISONE 20 MG PO TABS
ORAL_TABLET | ORAL | 0 refills | Status: DC
Start: 2023-09-18 — End: 2024-01-14

## 2023-09-18 MED ORDER — ALBUTEROL SULFATE HFA 108 (90 BASE) MCG/ACT IN AERS: 1.0000 | INHALATION_SPRAY | Freq: Four times a day (QID) | RESPIRATORY_TRACT | 0 refills | Status: AC | PRN

## 2023-09-18 NOTE — Assessment & Plan Note (Addendum)
History, HPI & PE suggestive of acute asthma exacerbation.   Start prednisone 20mg  by mouth once daily in AM for 5 days as she is [redacted] weeks pregnant.  Start Claritin (loratidine) 10mg  daily in the evening for allergies Refilled albuterol inhaler - 1-2 puffs every 6 hours as needed for wheezing/shortness of breath.  Return precautions provided.   I evaluated patient, was consulted regarding treatment, and agree with assessment and plan per Julaine Fusi, MSN, FNP student.   Mayra Reel, NP-C

## 2023-09-18 NOTE — Progress Notes (Signed)
Subjective:    Patient ID: Glenda Morrison, female    DOB: [birth year], 28 y.o.   MRN: [id number]  Cough Pertinent negatives include no chills, ear pain, fever or headaches.    Glenda Morrison is a very pleasant 28 y.o. female with a history of mild intermittent asthma, GERD, GAD, Covid-19 infection who presents today to discuss cough.   Symptom onset 22 days ago with a "wet cough" without other symptoms.   She describes her cough as "wet" with thick and white mucous that is clear during the day. Also with a tickle to her throat, shortness of breath, chest tightness, maxillary and frontal sinus pressure, nasal congestion.   She denies sick contacts, otalgia, fevers, chills, body aches. Overall she feels well, doesn't feel sick.   She's tried Mucinex without relief. She has been out of her albuterol inhaler for months.   She is currently [redacted] weeks pregnant.    Review of Systems  Constitutional:  Negative for chills and fever.  HENT:  Positive for sinus pressure. Negative for ear pain.   Respiratory:  Positive for cough and chest tightness.   Neurological:  Negative for headaches.         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    Social History   Socioeconomic History   Marital status: Married    Spouse name: Not on file   Number of children: Not on file   Years of education: Not on file   Highest education level: Not on file  Occupational History    Comment: Garden Designer, television/film set  Tobacco Use   Smoking status: Never   Smokeless tobacco: Never  Vaping Use   Vaping status: Never Used  Substance and Sexual Activity   Alcohol use: Not Currently    Alcohol/week: 0.0 standard drinks of alcohol    Comment: Socially   Drug use: No   Sexual activity: Yes   Other Topics Concern   Not on file  Social History Narrative   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   [employer] about college   [employer]   Social Drivers of Corporate investment banker Strain: Not on file  Food Insecurity: Not on file  Transportation Needs: Not on file  Physical Activity: Not on file  Stress: Not on file  Social Connections: Not on file  Intimate Partner Violence: Not on file    Past Surgical History:  Procedure Laterality Date   UPPER GI ENDOSCOPY     WISDOM TOOTH EXTRACTION      Family History  Problem Relation Age of Onset   Healthy Mother    Arthritis Father    Stroke Father    Pancreatic cancer Maternal Uncle    Stroke Paternal Uncle    Arthritis Maternal Grandmother    Esophageal cancer Maternal Grandmother    Cancer Maternal Grandmother        Stomach   Pancreatic cancer Maternal Grandfather    Alcohol abuse Paternal Grandfather    Breast cancer Other        great grandmother    No Known Allergies  Current Outpatient Medications on File Prior to Visit  Medication Sig Dispense Refill  diphenhydrAMINE (BENADRYL) 25 MG tablet Take 25 mg by mouth at bedtime as needed for sleep.     ibuprofen (ADVIL) 800 MG tablet Take 800 mg by mouth every 8 (eight) hours as needed for moderate pain or mild pain.     loratadine (CLARITIN) 10 MG tablet Take 10 mg by mouth daily as needed for allergies.     Prenatal Vit-Fe Fumarate-FA (PRENATAL MULTIVITAMIN) TABS tablet Take 1 tablet by mouth at bedtime.     No current facility-administered medications on file prior to visit.    BP 128/72   Pulse 99   Temp 97.6 F (36.4 C) (Temporal)   Ht 5\' 8"  (1.727 m)   Wt 177 lb (80.3 kg)   LMP 06/20/2022   SpO2 100%   BMI 26.91 kg/m  Objective:   Physical Exam Constitutional:      Appearance: She is not ill-appearing.  Cardiovascular:     Rate and Rhythm: Normal rate  and regular rhythm.  Pulmonary:     Effort: Pulmonary effort is normal.     Breath sounds: Examination of the right-upper field reveals wheezing and rhonchi. Examination of the left-upper field reveals wheezing and rhonchi. Examination of the right-lower field reveals rhonchi. Examination of the left-lower field reveals rhonchi. Wheezing and rhonchi present.  Musculoskeletal:     Cervical back: Neck supple.  Skin:    General: Skin is warm and dry.  Neurological:     Mental Status: She is alert and oriented to person, place, and time.  Psychiatric:        Mood and Affect: Mood normal.           Assessment & Plan:  Moderate persistent asthma with acute exacerbation Assessment & Plan: History, HPI & PE suggestive of acute asthma exacerbation.   Start prednisone 20mg  by mouth once daily in AM for 5 days as she is [redacted] weeks pregnant.  Start Claritin (loratidine) 10mg  daily in the evening for allergies Refilled albuterol inhaler - 1-2 puffs every 6 hours as needed for wheezing/shortness of breath.  Return precautions provided.   I evaluated patient, was consulted regarding treatment, and agree with assessment and plan per Julaine Fusi, MSN, FNP student.   Mayra Reel, NP-C   Orders: -     predniSONE; Take 1 tablet by mouth daily in the morning for 5 days.  Dispense: 5 tablet; Refill: 0  Mild intermittent asthma without complication -     Albuterol Sulfate HFA; Inhale 1-2 puffs into the lungs every 6 (six) hours as needed for wheezing or shortness of breath.  Dispense: 8 g; Refill: 0        Doreene Nest, NP

## 2023-09-18 NOTE — Patient Instructions (Addendum)
Start prednisone 20mg  by mouth once daily in the morning. Take for 5 days.   Continue Claritin OTC - take consistently each day at nighttime for allergies.   We have refilled your albuterol inhaler every 6 hours as needed for wheezing and shortness of breath.   Please contact us if your symptoms have not improved.   It was a pleasure seeing you today. Hope you feel well soon.

## 2023-09-18 NOTE — Progress Notes (Signed)
Acute Office Visit  Subjective:     Patient ID: Glenda Morrison, female    DOB: 1995/12/16, 28 y.o.   MRN: [id number]  Chief Complaint  Patient presents with   Cough    Productive cough x 1 month Pt is [redacted] weeks pregnant     Kriste Basque is a pleasant 28 year-old-female with history of asthma, GERD who presents today for acute cough x22 days. She describes the cough as dry in AM, coughing up mucous that is occasionally thick, white. The cough progresses to more productive and wet throughout the day, with occasional green mucous. The AM cough triggers gag reflex and she has bouts of emesis. She is [redacted] weeks pregnant. The cough persists into the evening and limits ability to rest adequately, with dry heaving. There are no reported triggers. The cough has been persistent since onset and is not better or worse since onset. She has no known sick contacts, except daughter who has same cough with no acute symptoms. She denies fever/chills. She takes loratadine OTC occasionally and is out of her albuterol/has not used during this acute episode. She has tried mucinex OTC with no relief.    Review of Systems  Constitutional:  Negative for chills and fever.  HENT:  Positive for congestion and sinus pain. Negative for ear discharge and ear pain.        Maxillary & frontal sinus pain/pressure. Nasal congestion with clear discharge  Respiratory:  Positive for cough and shortness of breath.        Productive cough. Difficulty catching breath after coughing.  Endo/Heme/Allergies:  Positive for environmental allergies.       Objective:    BP 128/72   Pulse 99   Temp 97.6 F (36.4 C) (Temporal)   Ht 5\' 8"  (1.727 m)   Wt 80.3 kg   LMP 06/20/2022   SpO2 100%   BMI 26.91 kg/m    Physical Exam Constitutional:      Appearance: Normal appearance.  HENT:     Right Ear: Tympanic membrane, ear canal and external ear normal.     Left Ear: Tympanic membrane, ear canal and external ear normal.     Nose:  Nose normal.     Mouth/Throat:     Mouth: Mucous membranes are moist.  Cardiovascular:     Rate and Rhythm: Normal rate and regular rhythm.     Heart sounds: Normal heart sounds.  Pulmonary:     Effort: Pulmonary effort is normal.     Breath sounds: Wheezing and rhonchi present.     Comments: Rhonchi throughout. Wheezing in bilateral upper lobes. Neurological:     Mental Status: She is alert.  Psychiatric:        Mood and Affect: Mood normal.        Behavior: Behavior normal.      No results found for any visits on 09/18/23.      Assessment & Plan:   Problem List Items Addressed This Visit       Respiratory   Mild intermittent asthma without complication   Relevant Medications   predniSONE (DELTASONE) 20 MG tablet   albuterol (VENTOLIN HFA) 108 (90 Base) MCG/ACT inhaler   Moderate persistent asthma with acute exacerbation - Primary   History, HPI & PE suggestive of acute asthma exacerbation.   Start prednisone 20mg  by mouth once daily in AM for 5 days Start Claritin (loratidine) 10mg  daily in the evening for allergies Refilled albuterol inhaler - 1-2 puffs every 6 hours  as needed for wheezing/shortness of breath      Relevant Medications   predniSONE (DELTASONE) 20 MG tablet   albuterol (VENTOLIN HFA) 108 (90 Base) MCG/ACT inhaler    Meds ordered this encounter  Medications   predniSONE (DELTASONE) 20 MG tablet    Sig: Take 1 tablet by mouth daily in the morning for 5 days.    Dispense:  5 tablet    Refill:  0   albuterol (VENTOLIN HFA) 108 (90 Base) MCG/ACT inhaler    Sig: Inhale 1-2 puffs into the lungs every 6 (six) hours as needed for wheezing or shortness of breath.    Dispense:  8 g    Refill:  0    Supervising Provider:   BEDSOLE, AMY E [[id number]]    Disposition: follow up based on symptoms   Lindell Spar, RN

## 2023-09-22 DIAGNOSIS — R051 Acute cough: Secondary | ICD-10-CM

## 2023-09-24 DIAGNOSIS — Z1331 Encounter for screening for depression: Secondary | ICD-10-CM | POA: Diagnosis not present

## 2023-09-24 DIAGNOSIS — Z369 Encounter for antenatal screening, unspecified: Secondary | ICD-10-CM | POA: Diagnosis not present

## 2023-09-25 MED ORDER — AMOXICILLIN-POT CLAVULANATE 875-125 MG PO TABS: 1.0000 | ORAL_TABLET | Freq: Two times a day (BID) | ORAL | 0 refills | Status: DC

## 2023-10-10 DIAGNOSIS — Z369 Encounter for antenatal screening, unspecified: Secondary | ICD-10-CM | POA: Diagnosis not present

## 2023-10-21 DIAGNOSIS — Z3480 Encounter for supervision of other normal pregnancy, unspecified trimester: Secondary | ICD-10-CM | POA: Diagnosis not present

## 2023-10-21 DIAGNOSIS — Z369 Encounter for antenatal screening, unspecified: Secondary | ICD-10-CM | POA: Diagnosis not present

## 2023-10-21 DIAGNOSIS — R309 Painful micturition, unspecified: Secondary | ICD-10-CM | POA: Diagnosis not present

## 2024-01-06 ENCOUNTER — Ambulatory Visit
Admission: RE | Admit: 2024-01-06 | Discharge: 2024-01-06 | Disposition: A | Discharge status: Home / Self Care | Source: Ambulatory Visit

## 2024-01-06 VITALS — BP 119/72 | HR 95 | Temp 97.8°F | Resp 18

## 2024-01-06 DIAGNOSIS — Z3A25 25 weeks gestation of pregnancy: Secondary | ICD-10-CM | POA: Diagnosis not present

## 2024-01-06 DIAGNOSIS — L02412 Cutaneous abscess of left axilla: Secondary | ICD-10-CM | POA: Diagnosis not present

## 2024-01-06 MED ORDER — CEPHALEXIN 500 MG PO CAPS: 500.0000 mg | ORAL_CAPSULE | Freq: Four times a day (QID) | ORAL | 0 refills | Status: DC

## 2024-01-06 NOTE — ED Triage Notes (Signed)
 Patient to Urgent Care with complaints of  an abscess present to her left armpit. Area is tender and painful. No drainage. Attempted to remove a head with tweezers w/o any drainage.   Symptoms x4 days. Reports various small areas x2 weeks.

## 2024-01-06 NOTE — ED Provider Notes (Signed)
 [facility]    CSN: [id number] Arrival date & time: 01/06/24  1148      History   Chief Complaint Chief Complaint  Patient presents with   Abscess    I have a very bad what looks like a pimple, but it's not on my arm near my armpit, it's extremely painful and very red and large. I'm also coming in because I have a very bad sore throat. I am [redacted] weeks pregnant. - Entered by patient    HPI Glenda Morrison is a 28 y.o. female.  Patient is [redacted] weeks pregnant.  She presents with tender, red, swollen area in her left axilla x 4 days.  No open wounds or drainage.  No fever or chills.  She attempted to remove the hair with tweezers but the area has not improved.  The history is provided by the patient and medical records.    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    Patient Active Problem List   Diagnosis Date Noted   Moderate persistent asthma with acute exacerbation 09/18/2023   Elevated serum hCG 11/08/2022   COVID-19 09/26/2022   Palpitations 07/16/2022   GAD (generalized anxiety disorder) 07/16/2022   Migraine 05/10/2021   Mild intermittent asthma without complication 05/10/2021   GERD (gastroesophageal reflux disease) 03/02/2014    Past Surgical History:  Procedure Laterality Date   UPPER GI ENDOSCOPY     WISDOM TOOTH EXTRACTION      OB History     Gravida  4   Para  1   Term  1   Preterm      AB  1   Living  1      SAB  1   IAB      Ectopic      Multiple  0   Live Births  1            Home Medications    Prior to Admission medications   Medication Sig Start Date End Date Taking? Authorizing Provider  cephALEXin (KEFLEX) 500 MG capsule Take 1 capsule (500 mg total) by mouth 4 (four) times daily. 01/06/24  Yes  Wellington Half, NP  ondansetron  (ZOFRAN -ODT) 8 MG disintegrating tablet Take 8 mg by mouth 3 (three) times daily. 11/01/23  Yes [provider]  albuterol  (VENTOLIN  HFA) 108 (90 Base) MCG/ACT inhaler Inhale 1-2 puffs into the lungs every 6 (six) hours as needed for wheezing or shortness of breath. 09/18/23   Gabriel John, NP  diphenhydrAMINE  (BENADRYL ) 25 MG tablet Take 25 mg by mouth at bedtime as needed for sleep.    [provider]  ibuprofen  (ADVIL ) 800 MG tablet Take 800 mg by mouth every 8 (eight) hours as needed for moderate pain or mild pain.    [provider]  loratadine (CLARITIN) 10 MG tablet Take 10 mg by mouth daily as needed for allergies.    [provider]  predniSONE  (DELTASONE ) 20 MG tablet Take 1 tablet by mouth daily in the morning for 5 days. Patient not taking: Reported on 01/06/2024 09/18/23   Clark, Katherine K, NP  Prenatal Vit-Fe Fumarate-FA (PRENATAL MULTIVITAMIN) TABS tablet Take 1 tablet by mouth at bedtime.    [provider]    Family History  Family History  Problem Relation Age of Onset   Healthy Mother    Arthritis Father    Stroke Father    Pancreatic cancer Maternal Uncle    Stroke Paternal Uncle    Arthritis Maternal Grandmother    Esophageal cancer Maternal Grandmother    Cancer Maternal Grandmother        Stomach   Pancreatic cancer Maternal Grandfather    Alcohol abuse Paternal Grandfather    Breast cancer Other        great grandmother    Social History Social History   Tobacco Use   Smoking status: Never   Smokeless tobacco: Never  Vaping Use   Vaping status: Never Used  Substance Use Topics   Alcohol use: Not Currently    Alcohol/week: 0.0 standard drinks of alcohol    Comment: Socially   Drug use: No     Allergies   Patient has no known allergies.   Review of Systems Review of Systems  Constitutional:  Negative for chills and fever.  Musculoskeletal:  Negative for arthralgias  and joint swelling.  Skin:  Positive for color change and wound.     Physical Exam Triage Vital Signs ED Triage Vitals [01/06/24 1158]  Encounter Vitals Group     BP 119/72     Systolic BP Percentile      Diastolic BP Percentile      Pulse Rate 95     Resp 18     Temp 97.8 F (36.6 C)     Temp src      SpO2 98 %     Weight      Height      Head Circumference      Peak Flow      Pain Score      Pain Loc      Pain Education      Exclude from Growth Chart    No data found.  Updated Vital Signs BP 119/72   Pulse 95   Temp 97.8 F (36.6 C)   Resp 18   LMP 06/20/2022   SpO2 98%   Visual Acuity Right Eye Distance:   Left Eye Distance:   Bilateral Distance:    Right Eye Near:   Left Eye Near:    Bilateral Near:     Physical Exam Constitutional:      General: She is not in acute distress. HENT:     Mouth/Throat:     Mouth: Mucous membranes are moist.  Cardiovascular:     Rate and Rhythm: Normal rate and regular rhythm.  Pulmonary:     Effort: Pulmonary effort is normal. No respiratory distress.  Musculoskeletal:        General: No deformity. Normal range of motion.  Skin:    General: Skin is warm and dry.     Capillary Refill: Capillary refill takes less than 2 seconds.     Findings: Erythema and lesion present.     Comments: Tender erythematous firm non-fluctuant area of induration in left axilla; no lesion or drainage.   Neurological:     General: No focal deficit present.     Mental Status: She is alert.     Sensory: No sensory deficit.     Motor: No weakness.      UC Treatments / Results  Labs (all labs ordered are listed, but only abnormal results are displayed) Labs Reviewed - No data to display  EKG   Radiology No results found.  Procedures Procedures (  including critical care time)  Medications Ordered in UC Medications - No data to display  Initial Impression / Assessment and Plan / UC Course  I have reviewed the triage vital  signs and the nursing notes.  Pertinent labs & imaging results that were available during my care of the patient were reviewed by me and considered in my medical decision making (see chart for details).   Left axillary abscess, [redacted] weeks pregnant.  No I&D indicated at this time, as the abscess is firm and nonfluctuant.  Because patient is [redacted] weeks pregnant, treating today with cephalexin.  Discussed warm compresses.  Education provided on skin abscess.  Instructed her to follow-up with her PCP or OB/GYN.  ED precautions given.  She agrees to plan of care.    Final Clinical Impressions(s) / UC Diagnoses   Final diagnoses:  Abscess of axilla, left  [redacted] weeks gestation of pregnancy     Discharge Instructions      Take the antibiotic as directed.  Follow up with your primary care provider or OB/GYN.       ED Prescriptions     Medication Sig Dispense Auth. Provider   cephALEXin (KEFLEX) 500 MG capsule Take 1 capsule (500 mg total) by mouth 4 (four) times daily. 28 capsule Wellington Half, NP      PDMP not reviewed this encounter.   Wellington Half, NP 01/06/24 1233

## 2024-01-06 NOTE — Discharge Instructions (Addendum)
 Take the antibiotic as directed.  Follow up with your primary care provider or OB/GYN.

## 2024-01-14 ENCOUNTER — Ambulatory Visit: Admitting: Primary Care

## 2024-01-14 ENCOUNTER — Encounter: Payer: Self-pay | Admitting: Primary Care

## 2024-01-14 VITALS — BP 124/82 | HR 67 | Temp 97.7°F | Ht 68.0 in | Wt 181.0 lb

## 2024-01-14 DIAGNOSIS — J4531 Mild persistent asthma with (acute) exacerbation: Secondary | ICD-10-CM

## 2024-01-14 DIAGNOSIS — L02412 Cutaneous abscess of left axilla: Secondary | ICD-10-CM

## 2024-01-14 MED ORDER — CEPHALEXIN 500 MG PO CAPS
500.0000 mg | ORAL_CAPSULE | Freq: Four times a day (QID) | ORAL | 0 refills | Status: DC
Start: 2024-01-14 — End: 2024-02-05

## 2024-01-14 MED ORDER — PREDNISONE 20 MG PO TABS: ORAL_TABLET | ORAL | 0 refills | Status: DC

## 2024-01-14 NOTE — Patient Instructions (Signed)
 Continue cephalexin  antibiotic 4 times daily for 3 additional days.  Start prednisone  20 mg tablets. Take 2 tablets by mouth once daily in the morning for 5 days.  Use albuterol  inhaler if needed.  Continue your Claritin allergy pill daily.  It was a pleasure to see you today!

## 2024-01-14 NOTE — Assessment & Plan Note (Signed)
 Improved, not quite yet healed.  Extend cephalexin  course for another 3 days.  She agrees. There is no indication for I&D given presentation.

## 2024-01-14 NOTE — Assessment & Plan Note (Signed)
 Uncontrolled today, likely from URI symptoms 2 weeks prior.  Start prednisone  20 mg tablets. Take 2 tablets by mouth once daily in the morning for 5 days. Continue albuterol  inhaler as needed. Continue Claritin 10 mg daily as needed.

## 2024-01-14 NOTE — Progress Notes (Signed)
 Subjective:    Patient ID: Glenda Morrison, female    DOB: 1996/01/12, 28 y.o.   MRN: [id number]  Wheezing  Associated symptoms include coughing.    Glenda Morrison is a very pleasant 28 y.o. female with a history of asthma, GERD, GAD who presents today to discuss multiple concerns.  She is currently [redacted] weeks pregnant.  1) Abscess: Evaluated in urgent care on 01/06/2024 for a 4-day history of left axillary erythema, tenderness, swelling for which was thought to be ingrown hair.  She was diagnosed with an abscess which did not qualify for I&D due to firmness and nonfluctuant character.  She was treated with cephalexin  500 mg 4 times daily and advised to follow-up with PCP.  Since her urgent care visit she's noticed a reduction in swelling and pain, but she's continued to notice the firm knot under her arm. She finished her last cephalexin  pill today. Her wound never drained. She's concerned it's not completely gone.  2) Wheezing: History of mild intermittent asthma.  Symptom onset approximately 2 weeks ago with URI symptoms.  Since then her URI symptoms have resolved except for cough, sinus drainage, and wheezing. She's been taking Claritin 10 mg daily. She's been using her albuterol  inhaler once daily for the last 1 week which helps temporarily.   She has a history of asthma exacerbation with URI symptoms. Was evaluated in January 2025 for the same, prednisone  helped. Outside of URI symptoms she rarely has to use her albuterol  inhaler.   Review of Systems  HENT:  Positive for congestion and postnasal drip.   Respiratory:  Positive for cough, chest tightness and wheezing.   Skin:  Positive for color change and wound.         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     Social History   Socioeconomic History   Marital status: Married    Spouse name: Not on file   Number of children: Not on file   Years of education: Not on file   Highest education level: Not on file  Occupational History    Comment: Garden Designer, television/film set  Tobacco Use   Smoking status: Never   Smokeless tobacco: Never  Vaping Use   Vaping status: Never Used  Substance and Sexual Activity   Alcohol use: Not Currently    Alcohol/week: 0.0 standard drinks of alcohol    Comment: Socially   Drug use: No   Sexual activity: Yes  Other Topics Concern   Not on file  Social History Narrative   Very artistic- wants to go into Higher education careers adviser from high school   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   [employer] about college   [employer]   Social Drivers of Corporate investment banker Strain: Not on file  Food Insecurity: Not on file  Transportation Needs: Not on file  Physical Activity: Not on file  Stress: Not on file  Social Connections: Not on file  Intimate Partner Violence: Not on file    Past Surgical History:  Procedure Laterality Date   UPPER GI ENDOSCOPY     WISDOM TOOTH EXTRACTION      Family History  Problem Relation Age of Onset   Healthy Mother    Arthritis Father  Stroke Father    Pancreatic cancer Maternal Uncle    Stroke Paternal Uncle    Arthritis Maternal Grandmother    Esophageal cancer Maternal Grandmother    Cancer Maternal Grandmother        Stomach   Pancreatic cancer Maternal Grandfather    Alcohol abuse Paternal Grandfather    Breast cancer Other        great grandmother    No Known Allergies  Current Outpatient Medications on File Prior to Visit  Medication Sig Dispense Refill   albuterol  (VENTOLIN  HFA) 108 (90 Base) MCG/ACT inhaler Inhale 1-2 puffs into the lungs every 6 (six) hours as needed for wheezing or shortness of breath. 8 g 0   loratadine (CLARITIN) 10 MG tablet Take 10 mg by mouth daily as needed  for allergies.     ondansetron  (ZOFRAN -ODT) 8 MG disintegrating tablet Take 8 mg by mouth 3 (three) times daily.     Prenatal Vit-Fe Fumarate-FA (PRENATAL MULTIVITAMIN) TABS tablet Take 1 tablet by mouth at bedtime.     No current facility-administered medications on file prior to visit.    BP 124/82   Pulse 67   Temp 97.7 F (36.5 C) (Temporal)   Ht 5\' 8"  (1.727 m)   Wt 181 lb (82.1 kg)   LMP 06/20/2022   SpO2 100%   BMI 27.52 kg/m  Objective:   Physical Exam Cardiovascular:     Rate and Rhythm: Normal rate and regular rhythm.  Pulmonary:     Effort: Pulmonary effort is normal.     Breath sounds: Examination of the right-upper field reveals rhonchi. Examination of the left-lower field reveals rhonchi. Rhonchi present. No wheezing.     Comments: Dry and congested cough noted during visit Musculoskeletal:     Cervical back: Neck supple.  Skin:    General: Skin is warm and dry.     Comments: 1.5 cm oval, deep, firm, mass to left axilla.  Nonfluctuant.  No opening wound.  Mild surrounding erythema.  Tender.  Neurological:     Mental Status: She is alert and oriented to person, place, and time.  Psychiatric:        Mood and Affect: Mood normal.           Assessment & Plan:  Abscess of left axilla Assessment & Plan: Improved, not quite yet healed.  Extend cephalexin  course for another 3 days.  She agrees. There is no indication for I&D given presentation.  Orders: -     Cephalexin ; Take 1 capsule (500 mg total) by mouth 4 (four) times daily.  Dispense: 12 capsule; Refill: 0  Mild persistent asthma with (acute) exacerbation Assessment & Plan: Uncontrolled today, likely from URI symptoms 2 weeks prior.  Start prednisone  20 mg tablets. Take 2 tablets by mouth once daily in the morning for 5 days. Continue albuterol  inhaler as needed. Continue Claritin 10 mg daily as needed.  Orders: -     predniSONE ; Take 2 tablets by mouth once daily in the morning for 5 days.   Dispense: 10 tablet; Refill: 0        Gabriel John, NP

## 2024-01-17 DIAGNOSIS — J4530 Mild persistent asthma, uncomplicated: Secondary | ICD-10-CM

## 2024-01-17 DIAGNOSIS — L03119 Cellulitis of unspecified part of limb: Secondary | ICD-10-CM

## 2024-02-05 MED ORDER — QVAR REDIHALER 40 MCG/ACT IN AERB: 2.0000 | INHALATION_SPRAY | Freq: Two times a day (BID) | RESPIRATORY_TRACT | 0 refills | Status: AC

## 2024-02-05 MED ORDER — CEPHALEXIN 500 MG PO CAPS: 500.0000 mg | ORAL_CAPSULE | Freq: Four times a day (QID) | ORAL | 0 refills | Status: AC

## 2024-02-05 NOTE — Telephone Encounter (Signed)

## 2024-03-20 LAB — OB RESULTS CONSOLE GBS: GBS: NEGATIVE

## 2024-03-31 ENCOUNTER — Telehealth (HOSPITAL_COMMUNITY): Payer: Self-pay | Admitting: *Deleted

## 2024-03-31 ENCOUNTER — Encounter (HOSPITAL_COMMUNITY): Payer: Self-pay | Admitting: *Deleted

## 2024-03-31 NOTE — Telephone Encounter (Signed)
 Preadmission screen

## 2024-03-31 NOTE — Patient Instructions (Signed)
 Glenda Morrison  03/31/2024   Your procedure is scheduled on:  04/09/2024  Arrive at 0530 at Entrance C on [address] at [facility]  and [address]. You are invited to use the FREE valet parking or use the Visitor's parking deck.  Pick up the phone at the desk and dial [phone].  Call this number if you have problems the morning of surgery: [phone]  Remember:   Do not eat food:(After Midnight) Desps de medianoche.  You may drink clear liquids until  ___0330__.  Clear liquids means a liquid you can see thru.  It can have color such as Cola or Kool aid.  Tea is OK and coffee as long as no milk or creamer of any kind.  Take these medicines the morning of surgery with A SIP OF WATER:  Bring your inhaler   Do not wear jewelry, make-up or nail polish.  Do not wear lotions, powders, or perfumes. Do not wear deodorant.  Do not shave 48 hours prior to surgery.  Do not bring valuables to the hospital.  [facility] is not   responsible for any belongings or valuables brought to the hospital.  Contacts, dentures or bridgework may not be worn into surgery.  Leave suitcase in the car. After surgery it may be brought to your room.  For patients admitted to the hospital, checkout time is 11:00 AM the day of              discharge.      Please read over the following fact sheets that you were given:     Preparing for Surgery

## 2024-04-01 ENCOUNTER — Encounter (HOSPITAL_COMMUNITY): Payer: Self-pay

## 2024-04-07 ENCOUNTER — Encounter (HOSPITAL_COMMUNITY)
Admission: RE | Admit: 2024-04-07 | Discharge: 2024-04-07 | Disposition: A | Discharge status: Home / Self Care | Source: Ambulatory Visit | Attending: Obstetrics and Gynecology | Admitting: Obstetrics and Gynecology

## 2024-04-07 DIAGNOSIS — Z01818 Encounter for other preprocedural examination: Secondary | ICD-10-CM | POA: Diagnosis present

## 2024-04-07 DIAGNOSIS — Z3A39 39 weeks gestation of pregnancy: Secondary | ICD-10-CM | POA: Diagnosis not present

## 2024-04-07 DIAGNOSIS — Z01812 Encounter for preprocedural laboratory examination: Secondary | ICD-10-CM | POA: Insufficient documentation

## 2024-04-07 DIAGNOSIS — O321XX0 Maternal care for breech presentation, not applicable or unspecified: Secondary | ICD-10-CM | POA: Insufficient documentation

## 2024-04-07 HISTORY — DX: Personal history of other complications of pregnancy, childbirth and the puerperium: Z87.59

## 2024-04-07 LAB — RPR: RPR Ser Ql: NONREACTIVE

## 2024-04-07 LAB — CBC
HCT: 36.8 % (ref 36.0–46.0)
Hemoglobin: 11.2 g/dL — ABNORMAL LOW (ref 12.0–15.0)
MCH: 23.7 pg — ABNORMAL LOW (ref 26.0–34.0)
MCHC: 30.4 g/dL (ref 30.0–36.0)
MCV: 77.8 fL — ABNORMAL LOW (ref 80.0–100.0)
Platelets: 229 K/uL (ref 150–400)
RBC: 4.73 MIL/uL (ref 3.87–5.11)
RDW: 15 % (ref 11.5–15.5)
WBC: 6 K/uL (ref 4.0–10.5)
nRBC: 0 % (ref 0.0–0.2)

## 2024-04-07 LAB — TYPE AND SCREEN
ABO/RH(D): O NEG
Antibody Screen: POSITIVE

## 2024-04-09 ENCOUNTER — Encounter (HOSPITAL_COMMUNITY)
Admission: RE | Disposition: A | Discharge status: Home / Self Care | Payer: Self-pay | Source: Home / Self Care | Attending: Obstetrics

## 2024-04-09 ENCOUNTER — Other Ambulatory Visit: Payer: Self-pay

## 2024-04-09 ENCOUNTER — Inpatient Hospital Stay (HOSPITAL_COMMUNITY): Admitting: Anesthesiology

## 2024-04-09 ENCOUNTER — Inpatient Hospital Stay (HOSPITAL_COMMUNITY)
Admission: RE | Admit: 2024-04-09 | Discharge: 2024-04-11 | DRG: 788 | Disposition: A | Discharge status: Home / Self Care | Attending: Obstetrics | Admitting: Obstetrics

## 2024-04-09 ENCOUNTER — Encounter (HOSPITAL_COMMUNITY): Payer: Self-pay | Admitting: Obstetrics

## 2024-04-09 DIAGNOSIS — K219 Gastro-esophageal reflux disease without esophagitis: Secondary | ICD-10-CM | POA: Diagnosis present

## 2024-04-09 DIAGNOSIS — O26893 Other specified pregnancy related conditions, third trimester: Secondary | ICD-10-CM | POA: Diagnosis present

## 2024-04-09 DIAGNOSIS — Z3A39 39 weeks gestation of pregnancy: Secondary | ICD-10-CM | POA: Diagnosis not present

## 2024-04-09 DIAGNOSIS — O321XX0 Maternal care for breech presentation, not applicable or unspecified: Secondary | ICD-10-CM | POA: Diagnosis present

## 2024-04-09 DIAGNOSIS — Z3A00 Weeks of gestation of pregnancy not specified: Secondary | ICD-10-CM

## 2024-04-09 DIAGNOSIS — Z6791 Unspecified blood type, Rh negative: Secondary | ICD-10-CM

## 2024-04-09 DIAGNOSIS — O9962 Diseases of the digestive system complicating childbirth: Secondary | ICD-10-CM | POA: Diagnosis present

## 2024-04-09 SURGERY — Surgical Case
Anesthesia: Spinal

## 2024-04-09 MED ORDER — CEFAZOLIN SODIUM-DEXTROSE 2-4 GM/100ML-% IV SOLN
INTRAVENOUS | Status: AC
  Filled 2024-04-09: qty 100

## 2024-04-09 MED ORDER — DEXAMETHASONE SODIUM PHOSPHATE 10 MG/ML IJ SOLN
INTRAMUSCULAR | Status: DC | PRN
  Administered 2024-04-09 (×2): 5 mg via INTRAVENOUS

## 2024-04-09 MED ORDER — MENTHOL 3 MG MT LOZG
1.0000 | LOZENGE | OROMUCOSAL | Status: DC | PRN
Start: 2024-04-09 — End: 2024-04-11

## 2024-04-09 MED ORDER — PHENYLEPHRINE HCL-NACL 20-0.9 MG/250ML-% IV SOLN
INTRAVENOUS | Status: AC
  Filled 2024-04-09: qty 250

## 2024-04-09 MED ORDER — ACETAMINOPHEN 500 MG PO TABS
1000.0000 mg | ORAL_TABLET | Freq: Four times a day (QID) | ORAL | Status: DC
  Administered 2024-04-09 – 2024-04-11 (×8): 1000 mg via ORAL
  Filled 2024-04-09 (×8): qty 2

## 2024-04-09 MED ORDER — LACTATED RINGERS IV SOLN: INTRAVENOUS | Status: DC

## 2024-04-09 MED ORDER — LACTATED RINGERS IV SOLN: INTRAVENOUS | Status: AC

## 2024-04-09 MED ORDER — DIBUCAINE (PERIANAL) 1 % EX OINT: 1.0000 | TOPICAL_OINTMENT | CUTANEOUS | Status: DC | PRN

## 2024-04-09 MED ORDER — ONDANSETRON HCL 4 MG/2ML IJ SOLN: 4.0000 mg | Freq: Three times a day (TID) | INTRAMUSCULAR | Status: DC | PRN

## 2024-04-09 MED ORDER — OXYTOCIN-SODIUM CHLORIDE 30-0.9 UT/500ML-% IV SOLN
INTRAVENOUS | Status: AC
  Filled 2024-04-09: qty 500

## 2024-04-09 MED ORDER — ONDANSETRON HCL 4 MG/2ML IJ SOLN
INTRAMUSCULAR | Status: DC | PRN
  Administered 2024-04-09: 4 mg via INTRAVENOUS

## 2024-04-09 MED ORDER — ACETAMINOPHEN 10 MG/ML IV SOLN
INTRAVENOUS | Status: DC | PRN
  Administered 2024-04-09: 1000 mg via INTRAVENOUS

## 2024-04-09 MED ORDER — ONDANSETRON HCL 4 MG/2ML IJ SOLN
INTRAMUSCULAR | Status: AC
  Filled 2024-04-09: qty 2

## 2024-04-09 MED ORDER — FENTANYL CITRATE (PF) 100 MCG/2ML IJ SOLN
INTRAMUSCULAR | Status: AC
Start: 2024-04-09 — End: 2024-04-09
  Filled 2024-04-09: qty 2

## 2024-04-09 MED ORDER — SCOPOLAMINE 1 MG/3DAYS TD PT72
1.0000 | MEDICATED_PATCH | Freq: Once | TRANSDERMAL | Status: DC
  Administered 2024-04-09: 1.5 mg via TRANSDERMAL

## 2024-04-09 MED ORDER — NALOXONE HCL 0.4 MG/ML IJ SOLN: 0.4000 mg | INTRAMUSCULAR | Status: DC | PRN

## 2024-04-09 MED ORDER — DIPHENHYDRAMINE HCL 50 MG/ML IJ SOLN
12.5000 mg | INTRAMUSCULAR | Status: DC | PRN
  Administered 2024-04-09 (×2): 12.5 mg via INTRAVENOUS
  Filled 2024-04-09: qty 1

## 2024-04-09 MED ORDER — CEFAZOLIN SODIUM-DEXTROSE 2-4 GM/100ML-% IV SOLN
2.0000 g | INTRAVENOUS | Status: AC
  Administered 2024-04-09: 2 g via INTRAVENOUS

## 2024-04-09 MED ORDER — PRENATAL MULTIVITAMIN CH
1.0000 | ORAL_TABLET | Freq: Every day | ORAL | Status: DC
  Administered 2024-04-10 – 2024-04-11 (×2): 1 via ORAL
  Filled 2024-04-09 (×2): qty 1

## 2024-04-09 MED ORDER — EPHEDRINE 5 MG/ML INJ
INTRAVENOUS | Status: AC
Start: 2024-04-09 — End: 2024-04-09
  Filled 2024-04-09: qty 5

## 2024-04-09 MED ORDER — KETOROLAC TROMETHAMINE 30 MG/ML IJ SOLN
30.0000 mg | Freq: Once | INTRAMUSCULAR | Status: AC | PRN
  Administered 2024-04-09: 30 mg via INTRAVENOUS

## 2024-04-09 MED ORDER — MORPHINE SULFATE (PF) 0.5 MG/ML IJ SOLN
INTRAMUSCULAR | Status: AC
  Filled 2024-04-09: qty 10

## 2024-04-09 MED ORDER — HYDROMORPHONE HCL 2 MG PO TABS
2.0000 mg | ORAL_TABLET | ORAL | Status: DC | PRN
  Administered 2024-04-10 – 2024-04-11 (×5): 2 mg via ORAL
  Filled 2024-04-09 (×5): qty 1

## 2024-04-09 MED ORDER — SCOPOLAMINE 1 MG/3DAYS TD PT72
MEDICATED_PATCH | TRANSDERMAL | Status: AC
  Filled 2024-04-09: qty 1

## 2024-04-09 MED ORDER — RHO D IMMUNE GLOBULIN 1500 UNIT/2ML IJ SOSY
300.0000 ug | PREFILLED_SYRINGE | Freq: Once | INTRAMUSCULAR | Status: AC
  Administered 2024-04-10: 300 ug via INTRAVENOUS
  Filled 2024-04-09: qty 2

## 2024-04-09 MED ORDER — KETOROLAC TROMETHAMINE 30 MG/ML IJ SOLN
30.0000 mg | Freq: Four times a day (QID) | INTRAMUSCULAR | Status: AC
  Administered 2024-04-09 – 2024-04-10 (×4): 30 mg via INTRAVENOUS
  Filled 2024-04-09 (×4): qty 1

## 2024-04-09 MED ORDER — DIPHENHYDRAMINE HCL 50 MG/ML IJ SOLN
INTRAMUSCULAR | Status: AC
  Filled 2024-04-09: qty 1

## 2024-04-09 MED ORDER — SIMETHICONE 80 MG PO CHEW: 80.0000 mg | CHEWABLE_TABLET | ORAL | Status: DC | PRN

## 2024-04-09 MED ORDER — OXYTOCIN-SODIUM CHLORIDE 30-0.9 UT/500ML-% IV SOLN
INTRAVENOUS | Status: DC | PRN
  Administered 2024-04-09: 300 mL via INTRAVENOUS

## 2024-04-09 MED ORDER — MORPHINE SULFATE (PF) 0.5 MG/ML IJ SOLN
INTRAMUSCULAR | Status: DC | PRN
  Administered 2024-04-09: 150 ug via INTRATHECAL

## 2024-04-09 MED ORDER — IBUPROFEN 600 MG PO TABS
600.0000 mg | ORAL_TABLET | Freq: Four times a day (QID) | ORAL | Status: DC
  Administered 2024-04-10 – 2024-04-11 (×4): 600 mg via ORAL
  Filled 2024-04-09 (×4): qty 1

## 2024-04-09 MED ORDER — NALOXONE HCL 4 MG/10ML IJ SOLN: 1.0000 ug/kg/h | INTRAVENOUS | Status: DC | PRN

## 2024-04-09 MED ORDER — COCONUT OIL OIL
1.0000 | TOPICAL_OIL | Status: DC | PRN
Start: 2024-04-09 — End: 2024-04-11

## 2024-04-09 MED ORDER — DEXAMETHASONE SODIUM PHOSPHATE 10 MG/ML IJ SOLN
INTRAMUSCULAR | Status: AC
  Filled 2024-04-09: qty 1

## 2024-04-09 MED ORDER — KETOROLAC TROMETHAMINE 30 MG/ML IJ SOLN
INTRAMUSCULAR | Status: AC
  Filled 2024-04-09: qty 1

## 2024-04-09 MED ORDER — SENNOSIDES-DOCUSATE SODIUM 8.6-50 MG PO TABS
2.0000 | ORAL_TABLET | ORAL | Status: DC
  Administered 2024-04-10 – 2024-04-11 (×2): 2 via ORAL
  Filled 2024-04-09 (×2): qty 2

## 2024-04-09 MED ORDER — MEPERIDINE HCL 25 MG/ML IJ SOLN: 6.2500 mg | INTRAMUSCULAR | Status: DC | PRN

## 2024-04-09 MED ORDER — DIPHENHYDRAMINE HCL 25 MG PO CAPS: 25.0000 mg | ORAL_CAPSULE | ORAL | Status: DC | PRN

## 2024-04-09 MED ORDER — FENTANYL CITRATE (PF) 100 MCG/2ML IJ SOLN
INTRAMUSCULAR | Status: DC | PRN
  Administered 2024-04-09: 15 ug via INTRATHECAL

## 2024-04-09 MED ORDER — BUPIVACAINE IN DEXTROSE 0.75-8.25 % IT SOLN
INTRATHECAL | Status: DC | PRN
  Administered 2024-04-09: 1.6 mL via INTRATHECAL

## 2024-04-09 MED ORDER — POVIDONE-IODINE 10 % EX SWAB
2.0000 | Freq: Once | CUTANEOUS | Status: AC
  Administered 2024-04-09: 2 via TOPICAL

## 2024-04-09 MED ORDER — SIMETHICONE 80 MG PO CHEW
80.0000 mg | CHEWABLE_TABLET | Freq: Three times a day (TID) | ORAL | Status: DC
  Administered 2024-04-09 – 2024-04-11 (×6): 80 mg via ORAL
  Filled 2024-04-09 (×6): qty 1

## 2024-04-09 MED ORDER — PHENYLEPHRINE HCL-NACL 20-0.9 MG/250ML-% IV SOLN
INTRAVENOUS | Status: DC | PRN
  Administered 2024-04-09: 60 ug/min via INTRAVENOUS

## 2024-04-09 MED ORDER — EPHEDRINE SULFATE-NACL 50-0.9 MG/10ML-% IV SOSY
PREFILLED_SYRINGE | INTRAVENOUS | Status: DC | PRN
  Administered 2024-04-09: 10 mg via INTRAVENOUS

## 2024-04-09 MED ORDER — SODIUM CHLORIDE 0.9% FLUSH: 3.0000 mL | INTRAVENOUS | Status: DC | PRN

## 2024-04-09 MED ORDER — DIPHENHYDRAMINE HCL 25 MG PO CAPS: 25.0000 mg | ORAL_CAPSULE | Freq: Four times a day (QID) | ORAL | Status: DC | PRN

## 2024-04-09 MED ORDER — OXYTOCIN-SODIUM CHLORIDE 30-0.9 UT/500ML-% IV SOLN
2.5000 [IU]/h | INTRAVENOUS | Status: AC
  Administered 2024-04-09: 2.5 [IU]/h via INTRAVENOUS
  Filled 2024-04-09: qty 500

## 2024-04-09 MED ORDER — WITCH HAZEL-GLYCERIN EX PADS: 1.0000 | MEDICATED_PAD | CUTANEOUS | Status: DC | PRN

## 2024-04-09 SURGICAL SUPPLY — 31 items
BENZOIN TINCTURE PRP APPL 2/3 (GAUZE/BANDAGES/DRESSINGS) ×1 IMPLANT
CHLORAPREP W/TINT 26 (MISCELLANEOUS) ×2 IMPLANT
CLAMP UMBILICAL CORD (MISCELLANEOUS) ×1 IMPLANT
CLOTH BEACON ORANGE TIMEOUT ST (SAFETY) ×1 IMPLANT
DRSG OPSITE POSTOP 4X10 (GAUZE/BANDAGES/DRESSINGS) ×1 IMPLANT
ELECTRODE REM PT RTRN 9FT ADLT (ELECTROSURGICAL) ×1 IMPLANT
EXTRACTOR VACUUM KIWI (MISCELLANEOUS) IMPLANT
GLOVE BIOGEL M STER SZ 6 (GLOVE) ×1 IMPLANT
GLOVE BIOGEL PI IND STRL 6.5 (GLOVE) ×1 IMPLANT
GLOVE BIOGEL PI IND STRL 7.0 (GLOVE) ×1 IMPLANT
GOWN STRL REUS W/TWL LRG LVL3 (GOWN DISPOSABLE) ×2 IMPLANT
KIT ABG SYR 3ML LUER SLIP (SYRINGE) IMPLANT
NDL HYPO 25X5/8 SAFETYGLIDE (NEEDLE) IMPLANT
NEEDLE HYPO 25X5/8 SAFETYGLIDE (NEEDLE) IMPLANT
NS IRRIG 1000ML POUR BTL (IV SOLUTION) ×1 IMPLANT
PACK C SECTION WH (CUSTOM PROCEDURE TRAY) ×1 IMPLANT
PAD OB MATERNITY 4.3X12.25 (PERSONAL CARE ITEMS) ×1 IMPLANT
RTRCTR C-SECT PINK 25CM LRG (MISCELLANEOUS) ×1 IMPLANT
STRIP CLOSURE SKIN 1/2X4 (GAUZE/BANDAGES/DRESSINGS) ×1 IMPLANT
SUT MNCRL 0 VIOLET CTX 36 (SUTURE) ×2 IMPLANT
SUT PDS AB 0 CTX 60 (SUTURE) IMPLANT
SUT PLAIN 0 NONE (SUTURE) IMPLANT
SUT PLAIN ABS 2-0 CT1 27XMFL (SUTURE) ×1 IMPLANT
SUT PROLENE 1 CT (SUTURE) IMPLANT
SUT VIC AB 0 CTX36XBRD ANBCTRL (SUTURE) ×2 IMPLANT
SUT VIC AB 2-0 CT1 TAPERPNT 27 (SUTURE) ×1 IMPLANT
SUT VIC AB 4-0 PS2 27 (SUTURE) ×1 IMPLANT
SUT VICRYL 4-0 PS2 18IN ABS (SUTURE) ×1 IMPLANT
TOWEL OR 17X24 6PK STRL BLUE (TOWEL DISPOSABLE) ×1 IMPLANT
TRAY FOLEY W/BAG SLVR 14FR LF (SET/KITS/TRAYS/PACK) ×1 IMPLANT
WATER STERILE IRR 1000ML POUR (IV SOLUTION) ×1 IMPLANT

## 2024-04-09 NOTE — H&P (Signed)
 28 y.o. [id number] @ [redacted]w[redacted]d presents for primary cesarean section for breech presentation.  She has declined ECV.  Otherwise has good fetal movement and no bleeding.  Pregnancy complicated: Mild anemia of pregnancy at 28 weeks.  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    Past Surgical History:  Procedure Laterality Date   UPPER GI ENDOSCOPY     WISDOM TOOTH EXTRACTION      OB History  Gravida Para Term Preterm AB Living  4 1 1  2 1   SAB IAB Ectopic Multiple Live Births  2   0 1    # Outcome Date GA Lbr Len/2nd Weight Sex Type Anes PTL Lv  4 Current           3 SAB 08/2022          2 Term 11/06/20 [redacted]w[redacted]d 37:01 / 01:05 2985 g F Vag-Spont EPI  LIV  1 SAB             Social History   Socioeconomic History   Marital status: Married    Spouse name: Not on file   Number of children: Not on file   Years of education: Not on file   Highest education level: Not on file  Occupational History    Comment: Garden Designer, television/film set  Tobacco Use   Smoking status: Never   Smokeless tobacco: Never  Vaping Use   Vaping status: Never Used  Substance and Sexual Activity   Alcohol use: Not Currently    Alcohol/week: 0.0 standard drinks of alcohol    Comment: Socially   Drug use: No   Sexual activity: Yes  Other Topics Concern   Not on file  Social History Narrative   Very artistic- wants to go into Higher education careers adviser from high school   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   [employer] about college   [employer]   Social Drivers of Corporate investment banker Strain: Not on file  Food Insecurity: Not on file  Transportation Needs: Not on file  Physical Activity: Not on file  Stress: Not on file   Social Connections: Not on file  Intimate Partner Violence: Not on file   Oxycodone     Prenatal Transfer Tool  Maternal Diabetes: No Genetic Screening: Declined Maternal Ultrasounds/Referrals: Normal Fetal Ultrasounds or other Referrals:  None Maternal Substance Abuse:  No Significant Maternal Medications:  None Significant Maternal Lab Results: Group B Strep negative Vaccinations: Tdap  ABO, Rh: --/--/O NEG (08/12 9092) Antibody: POS (08/12 0907) Rubella: Immune (01/15 0000) RPR: NON REACTIVE (08/12 0859)  HBsAg: Negative (01/15 0000)  HIV: Non-reactive (01/15 0000)  GBS: Negative/-- (07/25 0000)     Vitals:   04/09/24 0557  BP: 112/72  Pulse: 73  Resp: 20  Temp: 98.3 F (36.8 C)     General:  NAD Abdomen:  soft, gravid Ex:  no edema FHTs:  143  Limited bedside ultrasound confirms breech presentation again today  A/P   28 y.o. [id number] [redacted]w[redacted]d presents for primary cesarean section for breech presentation.  Discussed risks of cesarean section to include, but not limited to, infection, bleeding, damage to surrounding strutcures (including bowel, bladder, tubes, ovaries, nerves,  vessels, baby), need for additional procedures, risk of blood clot, need for transfusion. Consent signed  Ancef  2gm on call to OR   [facility] GEFFEL GRETTA

## 2024-04-09 NOTE — Transfer of Care (Signed)
 Immediate Anesthesia Transfer of Care Note  Patient: Glenda Morrison  Procedure(s) Performed: CESAREAN DELIVERY  Patient Location: PACU  Anesthesia Type:Spinal  Level of Consciousness: awake  Airway & Oxygen Therapy: Patient Spontanous Breathing  Post-op Assessment: Report given to RN and Post -op Vital signs reviewed and stable  Post vital signs: Reviewed and stable  Last Vitals:  Vitals Value Taken Time  BP 92/59 04/09/24 08:43  Temp    Pulse 61 04/09/24 08:45  Resp 14 04/09/24 08:45  SpO2 99 % 04/09/24 08:45  Vitals shown include unfiled device data.  Last Pain: There were no vitals filed for this visit.       Complications: No notable events documented.

## 2024-04-09 NOTE — Op Note (Signed)
 Cesarean Section Procedure Note  Pre-operative Diagnosis: 1. Intrauterine pregnancy at [redacted]w[redacted]d  2. Breech presentation  Post-operative Diagnosis: same as above  Surgeon: Jolene Gaskins, MD  Assistants: Rubie Husky, MD  Procedure: Primary low transverse cesarean section   Anesthesia: Spinal anesthesia  Estimated Blood Loss: 351 mL         Drains: Foley catheter         Specimens: placenta to L&D              Complications:  None; patient tolerated the procedure well.         Disposition: PACU - hemodynamically stable.  Findings:  Normal uterus, tubes and ovaries bilaterally.  Viable female infant, 3440 g (7 lb 9.3 oz) Apgars 9, 9.    Procedure Details   After spinal  anesthesia was found to adequate, the patient was placed in the dorsal supine position with a leftward tilt, prepped and draped in the usual sterile manner. A Pfannenstiel incision was made and carried down through the subcutaneous tissue to the fascia.  The fascia was incised in the midline and the fascial incision was extended laterally with Mayo scissors. The superior aspect of the fascial incision was grasped with two Kocher clamps, tented up and the rectus muscles dissected off sharply. The rectus was then dissected off with blunt dissection and Mayo scissors inferiorly. The rectus muscles were separated in the midline. The abdominal peritoneum was identified, tented up, entered bluntly, and the incision was extended superiorly and inferiorly with good visualization of the bladder. The Alexis retractor was deployed. The vesicouterine peritoneum was identified, tented up, entered sharply, and the bladder was low and well away from the lower uterine segment. A scalpel was then used to make a low transverse incision on the uterus which was extended in the cephalad-caudad direction with blunt dissection. The fluid was clear. The fetal breech was identified.  The left foot was grasped and delivered, followed by the right foot.   The remainder of the body and head were delivered via the usual breech maneuvers without difficulty.  After a 60 second delay per protocol, the cord was clamped and cut and the infant was passed to the waiting warmer.  The placenta was then delivered spontaneously, intact and appear normal, the uterus was cleared of all clot and debris   The hysterotomy was repaired with #0 Monocryl in running locked fashion.  A second imbricating layer of #0 Monocryl was placed.  Excellent hemostasis was noted.  The Alexis retractor was removed from the abdomen. The peritoneum was examined and all vessels noted to be hemostatic. The abdominal cavity was cleared of all clot and debris.  The peritoneum was closed with 2-0 vicryl in a running fashion.  The fascia and rectus muscles were inspected and were hemostatic. The fascia was closed with 0 Vicryl in a running fashion. The subcutaneous layer was irrigated and all bleeders cauterized. The subcutaneous layer was closed with 2-0 interrupted plain gut. The skin was closed with 4-0 vicryl in a subcuticular fashion. The incision was dressed with benzoine, steri strips and honeycomb dressing. All sponge lap and needle counts were correct x3. Patient tolerated the procedure well and recovered in stable condition following the procedure.

## 2024-04-09 NOTE — Anesthesia Procedure Notes (Signed)
 Spinal  Patient location during procedure: OR Start time: 04/09/2024 7:30 AM End time: 04/09/2024 7:32 AM Staffing Performed: anesthesiologist  Anesthesiologist: Dorethea Cordella SQUIBB, DO Performed by: Dorethea Cordella SQUIBB, DO Authorized by: Dorethea Cordella SQUIBB, DO   Preanesthetic Checklist Completed: patient identified, IV checked, site marked, risks and benefits discussed, surgical consent, monitors and equipment checked, pre-op evaluation and timeout performed Spinal Block Patient position: sitting Prep: DuraPrep Patient monitoring: heart rate, cardiac monitor, continuous pulse ox and blood pressure Approach: midline Location: L3-4 Injection technique: single-shot Needle Needle type: Pencan  Needle gauge: 24 G Needle length: 10 cm Assessment Events: CSF return Additional Notes Patient identified. Risks/Benefits/Options discussed with patient including but not limited to bleeding, infection, nerve damage, paralysis, failed block, incomplete pain control, headache, blood pressure changes, nausea, vomiting, reactions to medications, itching and postpartum back pain. Confirmed with bedside nurse the patient's most recent platelet count. Confirmed with patient that they are not currently taking any anticoagulation, have any bleeding history or any family history of bleeding disorders. Patient expressed understanding and wished to proceed. All questions were answered. Sterile technique was used throughout the entire procedure. Please see nursing notes for vital signs. Warning signs of high block given to the patient including shortness of breath, tingling/numbness in hands, complete motor block, or any concerning symptoms with instructions to call for help. Patient was given instructions on fall risk and not to get out of bed. All questions and concerns addressed with instructions to call with any issues or inadequate analgesia.

## 2024-04-09 NOTE — Anesthesia Postprocedure Evaluation (Signed)
 Anesthesia Post Note  Patient: Glenda Morrison  Procedure(s) Performed: CESAREAN DELIVERY     Patient location during evaluation: PACU Anesthesia Type: Spinal Level of consciousness: oriented and awake and alert Pain management: pain level controlled Vital Signs Assessment: post-procedure vital signs reviewed and stable Respiratory status: spontaneous breathing, respiratory function stable and patient connected to nasal cannula oxygen Cardiovascular status: blood pressure returned to baseline and stable Postop Assessment: no headache, no backache and no apparent nausea or vomiting Anesthetic complications: no   No notable events documented.  Last Vitals:  Vitals:   04/09/24 1229 04/09/24 1344  BP: 109/60 106/61  Pulse: 68 (!) 59  Resp: 16 18  Temp: 36.9 C 36.8 C  SpO2: 100% 100%    Last Pain:  Vitals:   04/09/24 1344  TempSrc: Oral  PainSc: 0-No pain                 [name]

## 2024-04-09 NOTE — Anesthesia Preprocedure Evaluation (Signed)
 Anesthesia Evaluation  Patient identified by MRN, date of birth, ID band Patient awake    Reviewed: Allergy & Precautions, NPO status , Patient's Chart, lab work & pertinent test results  Airway Mallampati: II  TM Distance: >3 FB Neck ROM: Full    Dental no notable dental hx.    Pulmonary asthma    Pulmonary exam normal        Cardiovascular negative cardio ROS  Rhythm:Regular Rate:Normal     Neuro/Psych  Headaches  Anxiety        GI/Hepatic Neg liver ROS,GERD  ,,  Endo/Other  negative endocrine ROS    Renal/GU   negative genitourinary   Musculoskeletal negative musculoskeletal ROS (+)    Abdominal Normal abdominal exam  (+)   Peds  Hematology Lab Results      Component                Value               Date                      WBC                      6.0                 04/07/2024                HGB                      11.2 (L)            04/07/2024                HCT                      36.8                04/07/2024                MCV                      77.8 (L)            04/07/2024                PLT                      229                 04/07/2024              Anesthesia Other Findings   Reproductive/Obstetrics (+) Pregnancy                              Anesthesia Physical Anesthesia Plan  ASA: 2  Anesthesia Plan: Spinal   Post-op Pain Management:    Induction:   PONV Risk Score and Plan: 2 and Treatment may vary due to age or medical condition and Ondansetron   Airway Management Planned: Natural Airway and Nasal Cannula  Additional Equipment: None  Intra-op Plan:   Post-operative Plan:   Informed Consent: I have reviewed the patients History and Physical, chart, labs and discussed the procedure including the risks, benefits and alternatives for the proposed anesthesia with the patient or authorized representative who has indicated his/her understanding and  acceptance.  Dental advisory given  Plan Discussed with: CRNA  Anesthesia Plan Comments:         Anesthesia Quick Evaluation

## 2024-04-10 LAB — CBC
HCT: 28.5 % — ABNORMAL LOW (ref 36.0–46.0)
Hemoglobin: 9 g/dL — ABNORMAL LOW (ref 12.0–15.0)
MCH: 24.3 pg — ABNORMAL LOW (ref 26.0–34.0)
MCHC: 31.6 g/dL (ref 30.0–36.0)
MCV: 76.8 fL — ABNORMAL LOW (ref 80.0–100.0)
Platelets: 212 K/uL (ref 150–400)
RBC: 3.71 MIL/uL — ABNORMAL LOW (ref 3.87–5.11)
RDW: 15.1 % (ref 11.5–15.5)
WBC: 9.9 K/uL (ref 4.0–10.5)
nRBC: 0 % (ref 0.0–0.2)

## 2024-04-10 MED ORDER — FERROUS SULFATE 325 (65 FE) MG PO TABS
325.0000 mg | ORAL_TABLET | ORAL | Status: DC
  Administered 2024-04-11: 325 mg via ORAL
  Filled 2024-04-10: qty 1

## 2024-04-10 NOTE — Progress Notes (Signed)
 CSW received and acknowledges consult for EDPS of 9.  Consult screened out due to 9 on EDPS does not warrant a CSW consult.  MOB whom scores are greater than 9/yes to question 10 on Edinburgh Postpartum Depression Screen warrants a CSW consult.   Enos Fling, Theresia Majors Clinical Social Worker [phone]

## 2024-04-10 NOTE — Progress Notes (Signed)
 CSW received consult for hx of Anxiety; and met with MOB to offer support and complete assessment.  CSW entered the room, introduced herself and acknowledged that her family was present. MOB gave CSW verbal permission to speak about anything while her family was present. CSW explained her role and the reason for the visit. CSW observed FOB holding the infant as MOB laid safely in the bed.  CSW inquired about MOB's mental health history. MOB reported experiencing anxiety prior to pregnancy; however she has not participated in therapy or been prescribed medication for support. CSW asked MOB how has she coped with her mental health overtime. MOB reported coping skills which included being around people. MOB reported minor PPD symptoms and they lasted the first few weeks. MOB reported her supports as FOB and her brother in law that were both present today. CSW provided education regarding the baby blues period vs. perinatal mood disorders, discussed treatment and gave resources for mental health follow up if concerns arise.  CSW recommends self-evaluation during the postpartum time period using the New Mom Checklist from Postpartum Progress and encouraged MOB to contact a medical professional if symptoms are noted at any time.  CSW assessed for safety with MOB SI/HI/DV;MOB denied all.  CSW asked MOB has she selected a pediatrician for the infant's follow up visits; MOB said [employer]. MOB reported having all essential items for the infant including a carseat, bassinet and crib for safe sleeping. CSW provided review of Sudden Infant Death Syndrome (SIDS) precautions.  CSW identifies no further need for intervention and no barriers to discharge at this time.   Rosina Molt, [place] Clinical Social Worker [phone]

## 2024-04-10 NOTE — Progress Notes (Signed)
 Subjective: Postpartum Day 1: Cesarean Delivery Patient is doing well this morning. Pain is controlled. Ambulating, voiding, tolerating PO. Minimal lochia.  Reports some constipation, not yet passing flatus  Objective: Patient Vitals for the past 24 hrs:  BP Temp Temp src Pulse Resp SpO2  04/10/24 0511 101/64 97.7 F (36.5 C) Oral (!) 52 16 99 %  04/10/24 0145 102/66 98.3 F (36.8 C) Oral 60 18 99 %  04/09/24 2153 107/61 98.1 F (36.7 C) Oral (!) 57 18 99 %  04/09/24 1653 -- 98.2 F (36.8 C) Oral -- 18 99 %  04/09/24 1344 106/61 98.3 F (36.8 C) Oral (!) 59 18 100 %  04/09/24 1229 109/60 98.5 F (36.9 C) Oral 68 16 100 %     Physical Exam:  General: alert, cooperative, and no distress Lochia: appropriate Uterine Fundus: firm Incision: pressure dressing partially removed, honeycomb dressing half saturated with old blood, incision appears intact without active bleeding.  DVT Evaluation: no calf edema or tenderness  Recent Labs    04/10/24 0532  HGB 9.0*  HCT 28.5*    Assessment/Plan: Glenda Morrison [id number] POD#1 sp PCS at  [redacted]w[redacted]d for breech 1. PPC: routine PP care, plans to remove pressure dressing in shower, then will request RN dressing change 2. Rh neg, baby Rh pos: rhogam ordered 3. ABLA, not clinically significant: continue PO iron every other day  Dispo: anticipate discharge tomorrow  Glenda Morrison [place] 04/10/2024, 12:02 PM

## 2024-04-11 LAB — RH IG WORKUP (INCLUDES ABO/RH)
Fetal Screen: NEGATIVE
Gestational Age(Wks): 39
Unit division: 0

## 2024-04-11 MED ORDER — HYDROMORPHONE HCL 2 MG PO TABS: 1.0000 mg | ORAL_TABLET | Freq: Four times a day (QID) | ORAL | 0 refills | Status: DC | PRN

## 2024-04-11 MED ORDER — IBUPROFEN 800 MG PO TABS: 800.0000 mg | ORAL_TABLET | Freq: Three times a day (TID) | ORAL | 1 refills | Status: DC | PRN

## 2024-04-11 MED ORDER — FERROUS SULFATE 325 (65 FE) MG PO TABS: 325.0000 mg | ORAL_TABLET | ORAL | 3 refills | Status: AC

## 2024-04-11 NOTE — Discharge Summary (Signed)
 Postpartum Discharge Summary  Date of Service updated     Patient Name: Glenda Morrison DOB: Aug 07, 1996 MRN: [id number]  Date of admission: 04/09/2024 Delivery date:04/09/2024 Delivering provider: GRETTA GUMS Date of discharge: 04/11/2024  Admitting diagnosis: Maternal care for breech presentation, fetus 1 [O32.1XX1] Breech presentation [O32.1XX0] Intrauterine pregnancy: [redacted]w[redacted]d     Secondary diagnosis:  Principal Problem:   Breech presentation  Additional problems: Rh neg    Discharge diagnosis: Term Pregnancy Delivered                                              Post partum procedures:rhogam Augmentation: N/A Complications: None  Hospital course: Sceduled C/S   28 y.o. yo [id number] at 106w0d was admitted to the hospital 04/09/2024 for scheduled cesarean section with the following indication:Malpresentation.Delivery details are as follows:  Membrane Rupture Time/Date: 7:53 AM,04/09/2024  Delivery Method:C-Section, Low Transverse Details of operation can be found in separate operative note.  Patient had a postpartum course complicated by none.  She is ambulating, tolerating a regular diet, passing flatus, and urinating well. Patient is discharged home in stable condition on  04/11/24        Newborn Data: Birth date:04/09/2024 Birth time:7:54 AM Gender:Female Living status:Living Apgars:9 ,9  Weight:3440 g    Immunizations administered: Immunization History  Administered Date(s) Administered   Tdap 03/12/2018, 08/16/2020    Physical exam  Vitals:   04/10/24 0511 04/10/24 1500 04/10/24 2036 04/11/24 0500  BP: 101/64 100/61 116/66 105/66  Pulse: (!) 52 68 (!) 50 (!) 59  Resp: 16 16 18 18   Temp: 97.7 F (36.5 C) 98 F (36.7 C) 97.8 F (36.6 C) 98.2 F (36.8 C)  TempSrc: Oral Oral Oral Oral  SpO2: 99%  99% 100%  Weight:      Height:       General: alert, cooperative, and no distress Lochia: appropriate Uterine Fundus: firm Incision: N/A DVT Evaluation: No  evidence of DVT seen on physical exam. Negative Homan's sign. No cords or calf tenderness. Labs: Lab Results  Component Value Date   WBC 9.9 04/10/2024   HGB 9.0 (L) 04/10/2024   HCT 28.5 (L) 04/10/2024   MCV 76.8 (L) 04/10/2024   PLT 212 04/10/2024      Latest Ref Rng & Units 07/17/2022    7:55 AM  CMP  Glucose 70 - 99 mg/dL 88   BUN 6 - 23 mg/dL 12   Creatinine 9.59 - 1.20 mg/dL 9.31   Sodium 864 - 854 mEq/L 134   Potassium 3.5 - 5.1 mEq/L 4.0   Chloride 96 - 112 mEq/L 102   CO2 19 - 32 mEq/L 25   Calcium  8.4 - 10.5 mg/dL 9.0   Total Protein 6.0 - 8.3 g/dL 7.0   Total Bilirubin 0.2 - 1.2 mg/dL 0.6   Alkaline Phos 39 - 117 U/L 55   AST 0 - 37 U/L 21   ALT 0 - 35 U/L 17    Edinburgh Score:    04/10/2024   12:02 PM  Edinburgh Postnatal Depression Scale Screening Tool  I have been able to laugh and see the funny side of things. 0  I have looked forward with enjoyment to things. 0  I have blamed myself unnecessarily when things went wrong. 2  I have been anxious or worried for no good reason. 3  I have felt  scared or panicky for no good reason. 2  Things have been getting on top of me. 1  I have been so unhappy that I have had difficulty sleeping. 0  I have felt sad or miserable. 1  I have been so unhappy that I have been crying. 0  The thought of harming myself has occurred to me. 0  Edinburgh Postnatal Depression Scale Total 9      After visit meds:  Allergies as of 04/11/2024       Reactions   Oxycodone  Other (See Comments)   hallucinations        Medication List     STOP taking these medications    ondansetron  8 MG disintegrating tablet Commonly known as: ZOFRAN -ODT       TAKE these medications    albuterol  108 (90 Base) MCG/ACT inhaler Commonly known as: VENTOLIN  HFA Inhale 1-2 puffs into the lungs every 6 (six) hours as needed for wheezing or shortness of breath.   ferrous sulfate  325 (65 FE) MG tablet Take 1 tablet (325 mg total) by  mouth every other day. Start taking on: April 13, 2024   HYDROmorphone  2 MG tablet Commonly known as: DILAUDID  Take 0.5 tablets (1 mg total) by mouth every 6 (six) hours as needed for severe pain (pain score 7-10) ((when tolerating fluids)).   ibuprofen  800 MG tablet Commonly known as: ADVIL  Take 1 tablet (800 mg total) by mouth every 8 (eight) hours as needed.   loratadine 10 MG tablet Commonly known as: CLARITIN Take 10 mg by mouth daily as needed for allergies.   OVER THE COUNTER MEDICATION Take 27 mg by mouth every other day.  Delightful Iron Vitamin Vegan Chewable   prenatal multivitamin Tabs tablet Take 1 tablet by mouth at bedtime.   Qvar  RediHaler 40 MCG/ACT inhaler Generic drug: beclomethasone Inhale 2 puffs into the lungs 2 (two) times daily. Rinse mouth after each use.         Discharge home in stable condition Infant Feeding: combination Infant Disposition:home with mother Discharge instruction: per After Visit Summary and Postpartum booklet. Activity: Advance as tolerated. Pelvic rest for 6 weeks.  Diet: routine diet Anticipated Birth Control: Unsure Postpartum Appointment:6 weeks Follow up Visit: [facility]    04/11/2024 Lavonia CHRISTELLA Guppy, MD

## 2024-04-11 NOTE — Progress Notes (Signed)
 Subjective: Postop Day 2: Cesarean Delivery Patient is doing well this morning. Pain is controlled. Ambulating, voiding, tolerating PO. Minimal lochia.  Passing flatus, no BM  Objective: Patient Vitals for the past 24 hrs:  BP Temp Temp src Pulse Resp SpO2  04/11/24 0500 105/66 98.2 F (36.8 C) Oral (!) 59 18 100 %  04/10/24 2036 116/66 97.8 F (36.6 C) Oral (!) 50 18 99 %  04/10/24 1500 100/61 98 F (36.7 C) Oral 68 16 --     Physical Exam:  General: alert, cooperative, and no distress Lochia: appropriate Uterine Fundus: firm Incision: Honeycomb CDI DVT Evaluation: no calf edema or tenderness  Recent Labs    04/10/24 0532  HGB 9.0*  HCT 28.5*    Assessment/Plan: Glenda Morrison [id number] POD#2 sp PCS at  [redacted]w[redacted]d for breech 1. PPC: routine PP care 2. Rh neg, baby Rh pos: rhogam ordered 3. ABLA, not clinically significant: continue PO iron every other day  Dispo: anticipate discharge today  Glenda Morrison 04/11/2024, 9:31 AM

## 2024-04-20 ENCOUNTER — Telehealth (HOSPITAL_COMMUNITY): Payer: Self-pay | Admitting: *Deleted

## 2024-04-20 NOTE — Telephone Encounter (Signed)
 Attempted hospital discharge follow-up call. Left message for patient to return RN call with any questions or concerns. Allean IVAR Carton, RN, 04/20/24, [phone]

## 2024-05-03 ENCOUNTER — Telehealth: Admitting: Family

## 2024-05-03 DIAGNOSIS — L0291 Cutaneous abscess, unspecified: Secondary | ICD-10-CM

## 2024-05-03 MED ORDER — AMOXICILLIN-POT CLAVULANATE 875-125 MG PO TABS: 1.0000 | ORAL_TABLET | Freq: Two times a day (BID) | ORAL | 0 refills | Status: DC

## 2024-05-03 NOTE — Progress Notes (Signed)
 Virtual Visit Consent   Glenda Morrison, you are scheduled for a virtual visit with a [facility] provider today. Just as with appointments in the office, your consent must be obtained to participate. Your consent will be active for this visit and any virtual visit you may have with one of our providers in the next 365 days. If you have a MyChart account, a copy of this consent can be sent to you electronically.  As this is a virtual visit, video technology does not allow for your provider to perform a traditional examination. This may limit your provider's ability to fully assess your condition. If your provider identifies any concerns that need to be evaluated in person or the need to arrange testing (such as labs, EKG, etc.), we will make arrangements to do so. Although advances in technology are sophisticated, we cannot ensure that it will always work on either your end or our end. If the connection with a video visit is poor, the visit may have to be switched to a telephone visit. With either a video or telephone visit, we are not always able to ensure that we have a secure connection.  By engaging in this virtual visit, you consent to the provision of healthcare and authorize for your insurance to be billed (if applicable) for the services provided during this visit. Depending on your insurance coverage, you may receive a charge related to this service.  I need to obtain your verbal consent now. Are you willing to proceed with your visit today? Glenda Morrison has provided verbal consent on 05/03/2024 for a virtual visit (video or telephone). Bari Learn, FNP  Date: 05/03/2024 6:37 PM   Virtual Visit via Video Note   I, Bari Learn, connected with  Glenda Morrison  ([id number], 1996-01-05) on 05/03/24 at  6:15 PM EDT by a video-enabled telemedicine application and verified that I am speaking with the correct person using two identifiers.  Location: Patient: Virtual Visit  Location Patient: Home Provider: Virtual Visit Location Provider: Home Office   I discussed the limitations of evaluation and management by telemedicine and the availability of in person appointments. The patient expressed understanding and agreed to proceed.    History of Present Illness: Glenda Morrison is a 28 y.o. who identifies as a female who was assigned female at birth, and is being seen today for abscess on her right side and left axilla that she noticed it 3-4 days ago. Reports the one in her axilla is hard and not draining. The abscess on right side started draining two days of purulent discharge. Reports the area is increased redness and hard. Reports aching pain of 4 out 10.   She has been using hot showers without relief. She is currently breastfeeding.   HPI: HPI  Problems:  Patient Active Problem List   Diagnosis Date Noted   Breech presentation 04/09/2024   Mild persistent asthma with (acute) exacerbation 09/18/2023   Elevated serum hCG 11/08/2022   COVID-19 09/26/2022   Palpitations 07/16/2022   GAD (generalized anxiety disorder) 07/16/2022   Migraine 05/10/2021   Mild intermittent asthma without complication 05/10/2021   Abscess of left axilla 03/05/2017   GERD (gastroesophageal reflux disease) 03/02/2014    Allergies:  Allergies  Allergen Reactions   Oxycodone  Other (See Comments)    hallucinations   Medications:  Current Outpatient Medications:    amoxicillin -clavulanate (AUGMENTIN ) 875-125 MG tablet, Take 1 tablet by mouth 2 (two) times daily., Disp: 14 tablet, Rfl: 0  albuterol  (VENTOLIN  HFA) 108 (90 Base) MCG/ACT inhaler, Inhale 1-2 puffs into the lungs every 6 (six) hours as needed for wheezing or shortness of breath., Disp: 8 g, Rfl: 0   beclomethasone (QVAR  REDIHALER) 40 MCG/ACT inhaler, Inhale 2 puffs into the lungs 2 (two) times daily. Rinse mouth after each use., Disp: 1 each, Rfl: 0   ferrous sulfate  325 (65 FE) MG tablet, Take 1 tablet (325 mg  total) by mouth every other day., Disp: 30 tablet, Rfl: 3   HYDROmorphone  (DILAUDID ) 2 MG tablet, Take 0.5 tablets (1 mg total) by mouth every 6 (six) hours as needed for severe pain (pain score 7-10) ((when tolerating fluids))., Disp: 10 tablet, Rfl: 0   ibuprofen  (ADVIL ) 800 MG tablet, Take 1 tablet (800 mg total) by mouth every 8 (eight) hours as needed., Disp: 60 tablet, Rfl: 1   loratadine (CLARITIN) 10 MG tablet, Take 10 mg by mouth daily as needed for allergies., Disp: , Rfl:    OVER THE COUNTER MEDICATION, Take 27 mg by mouth every other day.  Delightful Iron Vitamin Vegan Chewable, Disp: , Rfl:    Prenatal Vit-Fe Fumarate-FA (PRENATAL MULTIVITAMIN) TABS tablet, Take 1 tablet by mouth at bedtime., Disp: , Rfl:   Observations/Objective: Patient is well-developed, well-nourished in no acute distress.  Resting comfortably  at home.  Head is normocephalic, atraumatic.  No labored breathing.  Speech is clear and coherent with logical content.  Patient is alert and oriented at baseline.  Small hard abscess with no redness of left axilla  Erythemas, hard, tender abscess on right ribs.   Assessment and Plan: 1. Abscess (Primary) - amoxicillin -clavulanate (AUGMENTIN ) 875-125 MG tablet; Take 1 tablet by mouth 2 (two) times daily.  Dispense: 14 tablet; Refill: 0  Keep clean and dry  Warm compresses Avoid squeezing  Follow up if this week if area worsens or do not improve   Follow Up Instructions: I discussed the assessment and treatment plan with the patient. The patient was provided an opportunity to ask questions and all were answered. The patient agreed with the plan and demonstrated an understanding of the instructions.  A copy of instructions were sent to the patient via MyChart unless otherwise noted below.     The patient was advised to call back or seek an in-person evaluation if the symptoms worsen or if the condition fails to improve as anticipated.    Bari Learn, FNP

## 2024-05-06 ENCOUNTER — Ambulatory Visit

## 2024-05-07 ENCOUNTER — Ambulatory Visit
Admission: RE | Admit: 2024-05-07 | Discharge: 2024-05-07 | Disposition: A | Discharge status: Home / Self Care | Attending: Emergency Medicine | Admitting: Emergency Medicine

## 2024-05-07 VITALS — BP 119/83 | HR 98 | Temp 97.1°F | Resp 19

## 2024-05-07 DIAGNOSIS — L0291 Cutaneous abscess, unspecified: Secondary | ICD-10-CM

## 2024-05-07 DIAGNOSIS — L02411 Cutaneous abscess of right axilla: Secondary | ICD-10-CM

## 2024-05-07 DIAGNOSIS — L03111 Cellulitis of right axilla: Secondary | ICD-10-CM | POA: Diagnosis not present

## 2024-05-07 NOTE — Discharge Instructions (Addendum)
 Continue the Augmentin  as directed.  Keep your wound clean and dry.  Wash it gently twice a day with soap and water.  Then apply a topical antibiotic ointment and bandage.    Return here tomorrow for a wound recheck.

## 2024-05-07 NOTE — ED Provider Notes (Signed)
 Glenda Morrison    CSN: [id number] Arrival date & time: 05/07/24  0941      History   Chief Complaint Chief Complaint  Patient presents with   Abscess    Infected and draining cyst on right underarm Im in A lot of pain - Entered by patient    HPI Glenda Morrison is a 28 y.o. female.  Patient presents with a large painful red abscess in her right axilla x 5 days.  The center of the abscess has a small pustule that drained some yesterday while she was in the shower.  No drainage since then.  She has been treating the discomfort with Tylenol  and ibuprofen .  No fever or chills.  Patient had a telehealth visit on 05/03/2024; diagnosed with abscess; treated with Augmentin .  Patient is breast-feeding her infant.  The history is provided by the patient and medical records.    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    Patient Active Problem List   Diagnosis Date Noted   Breech presentation 04/09/2024   Mild persistent asthma with (acute) exacerbation 09/18/2023   Elevated serum hCG 11/08/2022   COVID-19 09/26/2022   Palpitations 07/16/2022   GAD (generalized anxiety disorder) 07/16/2022   Migraine 05/10/2021   Mild intermittent asthma without complication 05/10/2021   Abscess of left axilla 03/05/2017   GERD (gastroesophageal reflux disease) 03/02/2014    Past Surgical History:  Procedure Laterality Date   CESAREAN SECTION N/A 04/09/2024   Procedure: CESAREAN DELIVERY;  Surgeon: Gretta Gums, MD;  Location: [facility] LD ORS;  Service: Obstetrics;  Laterality: N/A;   UPPER GI ENDOSCOPY     WISDOM TOOTH EXTRACTION      OB History     Gravida  4   Para  2   Term  2   Preterm      AB  2   Living  2      SAB   2   IAB      Ectopic      Multiple  0   Live Births  2            Home Medications    Prior to Admission medications   Medication Sig Start Date End Date Taking? Authorizing Provider  albuterol  (VENTOLIN  HFA) 108 (90 Base) MCG/ACT inhaler Inhale 1-2 puffs into the lungs every 6 (six) hours as needed for wheezing or shortness of breath. 09/18/23   Clark, Katherine K, NP  amoxicillin -clavulanate (AUGMENTIN ) 875-125 MG tablet Take 1 tablet by mouth 2 (two) times daily. 05/03/24   Lavell Lye A, FNP  beclomethasone (QVAR  REDIHALER) 40 MCG/ACT inhaler Inhale 2 puffs into the lungs 2 (two) times daily. Rinse mouth after each use. 02/05/24   Clark, Katherine K, NP  ferrous sulfate  325 (65 FE) MG tablet Take 1 tablet (325 mg total) by mouth every other day. 04/13/24   Shivaji, Lavonia HERO, MD  HYDROmorphone  (DILAUDID ) 2 MG tablet Take 0.5 tablets (1 mg total) by mouth every 6 (six) hours as needed for severe pain (pain score 7-10) ((when tolerating fluids)). Patient not taking: Reported on 05/07/2024 04/11/24   Shivaji, Lavonia HERO, MD  ibuprofen  (ADVIL ) 800 MG tablet Take 1 tablet (800 mg total) by  mouth every 8 (eight) hours as needed. 04/11/24   Shivaji, Lavonia HERO, MD  loratadine (CLARITIN) 10 MG tablet Take 10 mg by mouth daily as needed for allergies.    [provider]  OVER THE COUNTER MEDICATION Take 27 mg by mouth every other day.  Delightful Iron Vitamin Vegan Chewable    [provider]  Prenatal Vit-Fe Fumarate-FA (PRENATAL MULTIVITAMIN) TABS tablet Take 1 tablet by mouth at bedtime.    [provider]    Family History Family History  Problem Relation Age of Onset   Healthy Mother    Arthritis Father    Stroke Father    Pancreatic cancer Maternal Uncle    Stroke Paternal Uncle    Arthritis Maternal Grandmother    Esophageal cancer Maternal Grandmother    Cancer Maternal Grandmother        Stomach   Pancreatic cancer Maternal Grandfather    Alcohol  abuse Paternal Grandfather    Breast cancer Other        great grandmother    Social History Social History   Tobacco Use   Smoking status: Never   Smokeless tobacco: Never  Vaping Use   Vaping status: Never Used  Substance Use Topics   Alcohol use: Not Currently    Alcohol/week: 0.0 standard drinks of alcohol    Comment: Socially   Drug use: No     Allergies   Oxycodone    Review of Systems Review of Systems  Constitutional:  Negative for chills and fever.  Musculoskeletal:  Negative for arthralgias and joint swelling.  Skin:  Positive for color change and wound.  Neurological:  Negative for weakness and numbness.     Physical Exam Triage Vital Signs ED Triage Vitals  Encounter Vitals Group     BP 05/07/24 1003 119/83     Girls Systolic BP Percentile --      Girls Diastolic BP Percentile --      Boys Systolic BP Percentile --      Boys Diastolic BP Percentile --      Pulse Rate 05/07/24 1003 98     Resp 05/07/24 1003 19     Temp 05/07/24 1003 (!) 97.1 F (36.2 C)     Temp src --      SpO2 05/07/24 1003 98 %     Weight --      Height --      Head Circumference --      Peak Flow --      Pain Score 05/07/24 1001 10     Pain Loc --      Pain Education --      Exclude from Growth Chart --    No data found.  Updated Vital Signs BP 119/83   Pulse 98   Temp (!) 97.1 F (36.2 C)   Resp 19   LMP 06/20/2022   SpO2 98%   Breastfeeding Yes   Visual Acuity Right Eye Distance:   Left Eye Distance:   Bilateral Distance:    Right Eye Near:   Left Eye Near:    Bilateral Near:     Physical Exam Constitutional:      General: She is not in acute distress. HENT:     Mouth/Throat:     Mouth: Mucous membranes are moist.  Cardiovascular:     Rate and Rhythm: Normal rate and regular rhythm.  Pulmonary:     Effort: Pulmonary effort is normal. No respiratory distress.  Skin:  General: Skin is warm and dry.     Findings: Erythema and lesion present.      Comments: Right axilla: 12 cm x 6 cm area of erythema with large area of tender fluctuant induration; small central pustule that is closed and not draining.  Neurological:     Mental Status: She is alert.      UC Treatments / Results  Labs (all labs ordered are listed, but only abnormal results are displayed) Labs Reviewed - No data to display  EKG   Radiology No results found.  Procedures Incision and Drainage  Date/Time: 05/07/2024 10:52 AM  Performed by: Corlis Burnard DEL, NP Authorized by: Corlis Burnard DEL, NP   Consent:    Consent obtained:  Verbal   Consent given by:  Patient   Risks discussed:  Bleeding, incomplete drainage, pain and infection Universal protocol:    Procedure explained and questions answered to patient or proxy's satisfaction: yes   Location:    Type:  Abscess   Location: Right axilla. Pre-procedure details:    Skin preparation:  Povidone-iodine  Anesthesia:    Anesthesia method:  Local infiltration   Local anesthetic:  Lidocaine  1% w/o epi Procedure type:    Complexity:  Complex Procedure details:    Incision types:  Single straight   Wound management:  Probed and deloculated   Drainage:  Purulent and bloody   Drainage amount:  Copious   Wound treatment:  Wound left open   Packing materials:  1/4 in iodoform gauze   Amount 1/4 iodoform:  8 cm Post-procedure details:    Procedure completion:  Tolerated well, no immediate complications  (including critical care time)  Medications Ordered in UC Medications - No data to display  Initial Impression / Assessment and Plan / UC Course  I have reviewed the triage vital signs and the nursing notes.  Pertinent labs & imaging results that were available during my care of the patient were reviewed by me and considered in my medical decision making (see chart for details).    Large abscess and right axilla with cellulitis, Lactating mother.  Afebrile and vital signs are stable.  I&D performed with  return of copious purulent drainage.  Wound left open with iodoform packing.  Wound care instructions discussed.  Education provided on abscess and cellulitis.  Instructed patient to return here tomorrow for wound recheck.  She is currently on Augmentin  that was prescribed on 05/03/2024 on a telehealth visit.  Instructed her to continue this Augmentin  as directed.  Discussed that she may need to extend the course of this antibiotic or change to a different antibiotic depending on what the wound looks like tomorrow.  She will discuss this with the provider that is here tomorrow.  Also instructed patient to follow-up with her PCP as she has had recurrent abscesses.  She agrees to plan of care.  Final Clinical Impressions(s) / UC Diagnoses   Final diagnoses:  Abscess of axilla, right  Cellulitis of axilla, right  Lactating mother     Discharge Instructions      Continue the Augmentin  as directed.  Keep your wound clean and dry.  Wash it gently twice a day with soap and water.  Then apply a topical antibiotic ointment and bandage.    Return here tomorrow for a wound recheck.     ED Prescriptions   None    PDMP not reviewed this encounter.   Corlis Burnard DEL, NP 05/07/24 1056

## 2024-05-07 NOTE — ED Triage Notes (Addendum)
 Patient to Urgent Care with complaints of an abscess to her right axilla w/ purulent drainage. Reports a lot of pain and redness.   Symptoms x5 days. Denies any fevers. Rotating tylenol  and motrin .

## 2024-05-08 ENCOUNTER — Ambulatory Visit: Admitting: Primary Care

## 2024-05-08 ENCOUNTER — Ambulatory Visit
Admission: EM | Admit: 2024-05-08 | Discharge: 2024-05-08 | Disposition: A | Discharge status: Home / Self Care | Attending: Emergency Medicine | Admitting: Emergency Medicine

## 2024-05-08 VITALS — BP 120/80 | HR 70 | Temp 98.1°F | Resp 18

## 2024-05-08 DIAGNOSIS — L02411 Cutaneous abscess of right axilla: Secondary | ICD-10-CM

## 2024-05-08 MED ORDER — CEPHALEXIN 500 MG PO CAPS: 500.0000 mg | ORAL_CAPSULE | Freq: Three times a day (TID) | ORAL | 0 refills | Status: AC

## 2024-05-08 NOTE — ED Triage Notes (Signed)
 Patient here for recheck of right axilla.

## 2024-05-08 NOTE — ED Provider Notes (Signed)
 [facility]    CSN: [id number] Arrival date & time: 05/08/24  1152      History   Chief Complaint Chief Complaint  Patient presents with   Abscess    HPI Glenda Morrison is a 28 y.o. female.   Patient presents for wound check for abscess that was drained 1 day ago.  Endorses significant tenderness to the area but pressure has been reduced.  Taking ibuprofen  and Tylenol , has 3 days  of Augmentin  left.  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    Patient Active Problem List   Diagnosis Date Noted   Breech presentation 04/09/2024   Mild persistent asthma with (acute) exacerbation 09/18/2023   Elevated serum hCG 11/08/2022   COVID-19 09/26/2022   Palpitations 07/16/2022   GAD (generalized anxiety disorder) 07/16/2022   Migraine 05/10/2021   Mild intermittent asthma without complication 05/10/2021   Abscess of left axilla 03/05/2017   GERD (gastroesophageal reflux disease) 03/02/2014    Past Surgical History:  Procedure Laterality Date   CESAREAN SECTION N/A 04/09/2024   Procedure: CESAREAN DELIVERY;  Surgeon: Gretta Gums, MD;  Location: [facility];  Service: Obstetrics;  Laterality: N/A;   UPPER GI ENDOSCOPY     WISDOM TOOTH EXTRACTION      OB History     Gravida  4   Para  2   Term  2   Preterm      AB  2   Living  2      SAB  2   IAB      Ectopic      Multiple  0   Live Births  2            Home Medications    Prior to Admission medications   Medication Sig Start Date End Date Taking? Authorizing Provider  cephALEXin  (KEFLEX ) 500 MG capsule Take 1 capsule (500 mg total) by mouth 3 (three) times daily for 5 days. 05/08/24 05/13/24 Yes Brandice Busser, Shelba SAUNDERS, NP  albuterol   (VENTOLIN  HFA) 108 (90 Base) MCG/ACT inhaler Inhale 1-2 puffs into the lungs every 6 (six) hours as needed for wheezing or shortness of breath. 09/18/23   Clark, Katherine K, NP  amoxicillin -clavulanate (AUGMENTIN ) 875-125 MG tablet Take 1 tablet by mouth 2 (two) times daily. 05/03/24   Lavell Bari LABOR, FNP  beclomethasone (QVAR  REDIHALER) 40 MCG/ACT inhaler Inhale 2 puffs into the lungs 2 (two) times daily. Rinse mouth after each use. 02/05/24   Clark, Katherine K, NP  ferrous sulfate  325 (65 FE) MG tablet Take 1 tablet (325 mg total) by mouth every other day. 04/13/24   Shivaji, Lavonia HERO, MD  HYDROmorphone  (DILAUDID ) 2 MG tablet Take 0.5 tablets (1 mg total) by mouth every 6 (six) hours as needed for severe pain (pain score 7-10) ((when tolerating fluids)). Patient not taking: Reported on 05/07/2024 04/11/24   Shivaji, Lavonia HERO, MD  ibuprofen  (ADVIL ) 800 MG tablet Take 1 tablet (800 mg total) by mouth every 8 (eight) hours as needed. 04/11/24   Shivaji, Lavonia HERO, MD  loratadine (CLARITIN) 10 MG tablet Take 10 mg by mouth daily as needed for allergies.    [provider]  OVER THE COUNTER MEDICATION Take 45  mg by mouth every other day.  Delightful Iron Vitamin Vegan Chewable    [provider]  Prenatal Vit-Fe Fumarate-FA (PRENATAL MULTIVITAMIN) TABS tablet Take 1 tablet by mouth at bedtime.    [provider]    Family History Family History  Problem Relation Age of Onset   Healthy Mother    Arthritis Father    Stroke Father    Pancreatic cancer Maternal Uncle    Stroke Paternal Uncle    Arthritis Maternal Grandmother    Esophageal cancer Maternal Grandmother    Cancer Maternal Grandmother        Stomach   Pancreatic cancer Maternal Grandfather    Alcohol abuse Paternal Grandfather    Breast cancer Other        great grandmother    Social History Social History   Tobacco Use   Smoking status: Never   Smokeless tobacco: Never  Vaping Use   Vaping status:  Never Used  Substance Use Topics   Alcohol use: Not Currently    Alcohol/week: 0.0 standard drinks of alcohol    Comment: Socially   Drug use: No     Allergies   Oxycodone    Review of Systems Review of Systems   Physical Exam Triage Vital Signs ED Triage Vitals  Encounter Vitals Group     BP 05/08/24 1222 120/80     Girls Systolic BP Percentile --      Girls Diastolic BP Percentile --      Boys Systolic BP Percentile --      Boys Diastolic BP Percentile --      Pulse Rate 05/08/24 1222 70     Resp 05/08/24 1222 18     Temp 05/08/24 1222 98.1 F (36.7 C)     Temp Source 05/08/24 1222 Oral     SpO2 05/08/24 1222 98 %     Weight --      Height --      Head Circumference --      Peak Flow --      Pain Score 05/08/24 1227 0     Pain Loc --      Pain Education --      Exclude from Growth Chart --    No data found.  Updated Vital Signs BP 120/80 (BP Location: Left Arm)   Pulse 70   Temp 98.1 F (36.7 C) (Oral)   Resp 18   LMP 06/20/2022   SpO2 98%   Visual Acuity Right Eye Distance:   Left Eye Distance:   Bilateral Distance:    Right Eye Near:   Left Eye Near:    Bilateral Near:     Physical Exam Constitutional:      Appearance: Normal appearance.  Eyes:     Extraocular Movements: Extraocular movements intact.  Pulmonary:     Effort: Pulmonary effort is normal.  Skin:    Comments: 1 x 2 cm abscess present to the right axilla with surrounding erythema, tender to touch and Benjamine Strout purulent drainage present  Neurological:     Mental Status: She is alert and oriented to person, place, and time. Mental status is at baseline.      UC Treatments / Results  Labs (all labs ordered are listed, but only abnormal results are displayed) Labs Reviewed - No data to display  EKG   Radiology No results found.  Procedures Procedures (including critical care time)  Medications Ordered in UC Medications - No data to display  Initial Impression /  Assessment and Plan / UC Course  I have reviewed the triage vital signs and the nursing notes.  Pertinent labs & imaging results that were available during my care of the patient were reviewed by me and considered in my medical decision making (see chart for details).  Abscess of right axilla  Packing removed and wound left open and covered with a nonadherent dressing, recommended daily cleansing and covering as needed advised finishing Augmentin  and added cephalexin , recommended over-the-counter antifungal medicine if yeast symptoms occur, advised continued use of over-the-counter analgesics for pain management and advised follow-up for any concerns regarding feeling Final Clinical Impressions(s) / UC Diagnoses   Final diagnoses:  Abscess of right axilla     Discharge Instructions      Today you were evaluated for your abscess and the packing material has been removed  Continue Augmentin  as directed  Will add cephalexin  every 8 hours for 5 days to ensure all infection and germ go away  Cleanse of the area with unscented soap and water at least once daily during normal hygiene, pat do not rub then cover with a nonadherent dressing and draining or getting irritated due to clothing  May apply ice or heat over the affected area in 10 to 15-minute intervals for comfort  You may continue use of Tylenol  and/or Motrin  as needed for pain  May follow-up for any concerns regarding healing   ED Prescriptions     Medication Sig Dispense Auth. Provider   cephALEXin  (KEFLEX ) 500 MG capsule Take 1 capsule (500 mg total) by mouth 3 (three) times daily for 5 days. 15 capsule Candia Kingsbury R, NP      PDMP not reviewed this encounter.   Teresa Shelba SAUNDERS, NP 05/08/24 1235

## 2024-05-08 NOTE — Discharge Instructions (Signed)
 Today you were evaluated for your abscess and the packing material has been removed  Continue Augmentin  as directed  Will add cephalexin  every 8 hours for 5 days to ensure all infection and germ go away  Cleanse of the area with unscented soap and water at least once daily during normal hygiene, pat do not rub then cover with a nonadherent dressing and draining or getting irritated due to clothing  May apply ice or heat over the affected area in 10 to 15-minute intervals for comfort  You may continue use of Tylenol  and/or Motrin  as needed for pain  May follow-up for any concerns regarding healing

## 2024-05-13 ENCOUNTER — Ambulatory Visit: Admitting: Primary Care

## 2024-05-19 ENCOUNTER — Ambulatory Visit (INDEPENDENT_AMBULATORY_CARE_PROVIDER_SITE_OTHER): Admitting: Primary Care

## 2024-05-19 ENCOUNTER — Encounter: Payer: Self-pay | Admitting: Primary Care

## 2024-05-19 VITALS — BP 108/64 | HR 57 | Temp 98.1°F | Ht 69.0 in | Wt 178.0 lb

## 2024-05-19 DIAGNOSIS — L0292 Furuncle, unspecified: Secondary | ICD-10-CM | POA: Diagnosis not present

## 2024-05-19 MED ORDER — MUPIROCIN 2 % EX OINT: 1.0000 | TOPICAL_OINTMENT | Freq: Two times a day (BID) | CUTANEOUS | 0 refills | Status: AC | PRN

## 2024-05-19 NOTE — Patient Instructions (Signed)
 You may apply the mupirocin  ointment twice daily if needed for new boil.  You can try chlorhexidine wipes to keep the surface clean if needed.  You will either be contacted via phone regarding your referral to dermatology, or you may receive a letter on your MyChart portal from our referral team with instructions for scheduling an appointment. Please let us  know if you have not been contacted by anyone within two weeks.  It was a pleasure to see you today!

## 2024-05-19 NOTE — Progress Notes (Signed)
 Subjective:    Patient ID: Glenda Morrison, female    DOB: February 25, 1996, 28 y.o.   MRN: [id number]  Tillie Viverette is a very Glenda 28 y.o. female with a history of migraines, asthma, GAD, abscess of left axilla who presents today to discuss abscess of right axilla.  Initially evaluated for ED visit on 05/03/2024 for a 3-4-day history of painful abscess to right axilla with purulent discharge.  She was prescribed a course of Augmentin  twice daily x 7 days.  Evaluated at urgent care on 05/07/2024 for a 5-day history of large, painful, red abscess to the right axilla.  The axilla had drained while she was in the shower that day.  During her visit she underwent I&D which revealed copious purulent drainage.  The wound was left open and packed.  She was advised to continue the Augmentin  as prescribed.  We evaluated urgent care on 05/08/2024 for wound check in the right axilla.  Augmentin  was continued and cephalexin  500 mg 3 times daily x 5 days was added.  Today her abscess has improved. There is some firmness to the site along with dryness and scabbing. She has completed her antibiotics.   She is postpartum as of 04/09/2024. She's had 6 abscesses to the axilla this year. She questions if her abscesses/boils are hormonal.  She is currently breast-feeding.  She denies changes in any soaps or detergents or deodorants.  She is careful to avoid shaving.   Review of Systems  Constitutional:  Negative for fever.  Skin:  Positive for color change and wound.         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    Social History   Socioeconomic History   Marital status: Married    Spouse name: Not on file   Number of children: Not on file    Years of education: Not on file   Highest education level: Not on file  Occupational History    Comment: Garden Designer, television/film set  Tobacco Use   Smoking status: Never   Smokeless tobacco: Never  Vaping Use   Vaping status: Never Used  Substance and Sexual Activity   Alcohol use: Not Currently    Alcohol/week: 0.0 standard drinks of alcohol    Comment: Socially   Drug use: No   Sexual activity: Yes  Other Topics Concern   Not on file  Social History Narrative   Very artistic- wants to go into Higher education careers adviser from high school   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   [employer] about college   virg[employer]   Social Drivers of Corporate investment banker Strain: Not on file  Food Insecurity: Not on file  Transportation Needs: Not on file  Physical Activity: Not on file  Stress: Not on file  Social Connections: Not on file  Intimate Partner Violence: Not on file    Past Surgical History:  Procedure Laterality Date   CESAREAN SECTION N/A 04/09/2024   Procedure: CESAREAN DELIVERY;  Surgeon: Gretta Gums, MD;  Location: [facility];  Service: Obstetrics;  Laterality: N/A;   UPPER GI ENDOSCOPY     WISDOM TOOTH EXTRACTION      Family History  Problem Relation Age  of Onset   Healthy Mother    Arthritis Father    Stroke Father    Pancreatic cancer Maternal Uncle    Stroke Paternal Uncle    Arthritis Maternal Grandmother    Esophageal cancer Maternal Grandmother    Cancer Maternal Grandmother        Stomach   Pancreatic cancer Maternal Grandfather    Alcohol abuse Paternal Grandfather    Breast cancer Other        great grandmother    Allergies  Allergen Reactions   Oxycodone  Other (See Comments)    hallucinations    Current Outpatient Medications on File Prior to Visit  Medication Sig Dispense Refill   albuterol  (VENTOLIN  HFA) 108 (90 Base) MCG/ACT inhaler Inhale 1-2 puffs into the lungs every 6 (six) hours as needed for wheezing or shortness of  breath. 8 g 0   beclomethasone (QVAR  REDIHALER) 40 MCG/ACT inhaler Inhale 2 puffs into the lungs 2 (two) times daily. Rinse mouth after each use. 1 each 0   ferrous sulfate  325 (65 FE) MG tablet Take 1 tablet (325 mg total) by mouth every other day. 30 tablet 3   ibuprofen  (ADVIL ) 800 MG tablet Take 1 tablet (800 mg total) by mouth every 8 (eight) hours as needed. 60 tablet 1   loratadine (CLARITIN) 10 MG tablet Take 10 mg by mouth daily as needed for allergies.     OVER THE COUNTER MEDICATION Take 27 mg by mouth every other day.  Delightful Iron Vitamin Vegan Chewable     Prenatal Vit-Fe Fumarate-FA (PRENATAL MULTIVITAMIN) TABS tablet Take 1 tablet by mouth at bedtime.     amoxicillin -clavulanate (AUGMENTIN ) 875-125 MG tablet Take 1 tablet by mouth 2 (two) times daily. (Patient not taking: Reported on 05/19/2024) 14 tablet 0   HYDROmorphone  (DILAUDID ) 2 MG tablet Take 0.5 tablets (1 mg total) by mouth every 6 (six) hours as needed for severe pain (pain score 7-10) ((when tolerating fluids)). (Patient not taking: Reported on 05/19/2024) 10 tablet 0   No current facility-administered medications on file prior to visit.    BP 108/64   Pulse (!) 57   Temp 98.1 F (36.7 C) (Temporal)   Ht 5' 9 (1.753 m)   Wt 178 lb (80.7 kg)   LMP 06/20/2022   SpO2 98%   Breastfeeding Yes   BMI 26.29 kg/m  Objective:   Physical Exam Skin:    General: Skin is warm and dry.     Findings: Erythema present.     Comments: Mild 3.5 cm of medium colored erythema without warmth. Closed wound. Non tender. Slight firmness noted at site.  Neurological:     Mental Status: She is alert.     Physical Exam        Assessment & Plan:  Recurrent boils Assessment & Plan: Improving, nearly healed.  We discussed ways for prevention including good health hygiene, avoid shaving, mild soaps/linens.  Prescription for mupirocin  ointment provided to use if needed for start of another boil/abscess. We also discussed  chlorhexidine wipes as needed.  Referral placed to dermatology.  Urgent care notes reviewed.  Orders: -     Mupirocin ; Apply 1 Application topically 2 (two) times daily as needed.  Dispense: 30 g; Refill: 0 -     Ambulatory referral to Dermatology    Assessment and Plan Assessment & Plan         Comer MARLA Gaskins, NP     History of Present Illness

## 2024-05-19 NOTE — Assessment & Plan Note (Signed)
 Improving, nearly healed.  We discussed ways for prevention including good health hygiene, avoid shaving, mild soaps/linens.  Prescription for mupirocin  ointment provided to use if needed for start of another boil/abscess. We also discussed chlorhexidine wipes as needed.  Referral placed to dermatology.  Urgent care notes reviewed.

## 2024-06-10 ENCOUNTER — Ambulatory Visit

## 2024-06-17 ENCOUNTER — Ambulatory Visit

## 2024-06-17 DIAGNOSIS — L988 Other specified disorders of the skin and subcutaneous tissue: Secondary | ICD-10-CM

## 2024-06-17 DIAGNOSIS — Q833 Accessory nipple: Secondary | ICD-10-CM | POA: Diagnosis not present

## 2024-06-17 DIAGNOSIS — L732 Hidradenitis suppurativa: Secondary | ICD-10-CM

## 2024-06-17 MED ORDER — CLINDAMYCIN PHOSPHATE 1 % EX SWAB: 1.0000 | Freq: Two times a day (BID) | CUTANEOUS | 5 refills | Status: AC

## 2024-06-17 NOTE — Progress Notes (Signed)
    Subjective   Glenda Morrison is a 28 y.o. female who presents for the following: Lesion(s) of concern . Patient is new patient.  Today patient reports: Lesions of concern, patient reports abrasions/cysts at underarms since getting pregnant. Patient states she did have one drained at urgent care at [place] it was so bad. Patient reports no flares today.   Review of Systems:    No other skin or systemic complaints except as noted in HPI or Assessment and Plan.  The following portions of the chart were reviewed this encounter and updated as appropriate: medications, allergies, medical history  Relevant Medical History:  n/a   Objective  Well appearing patient in no apparent distress; mood and affect are within normal limits. Examination was performed of the: Focused Exam of: Bilateral axilla   Examination notable for: Hidradenitis Suppurativa: Firm, erythematous papulonodules and cysts in the axilla  Bilateral supernumerary nipples  Examination limited by: Undergarments, Clothing, and Patient deferred removal       Assessment & Plan   Favor mild hidradenitis suppurativa Vs less likely recurrent staph infection  Chronic and persistent condition with duration or expected duration over one year. Condition is symptomatic and bothersome to patient. Patient is flaring and not currently at treatment goal.  - Discussed the chronic, relapsing nature of this skin disease, characterized by recurring inflamed painful nodules with abscess and sinus formation, and scarring -Start benzoyl peroxide wash daily to active areas  -Start topical clindamycin swabs  1% once daily to active areas  - Discussed doxycycline  100 mg BID prn flares. Patient will message/call when needed and can send in.   - currently breastfeeding  -Recommend returning to care next time flaring to culture.  Bilateral supernumerary nipples - Referral to general surgery for excision   RTC for FBSE and follow up    Level  of service outlined above   Procedures, orders, diagnosis for this visit:  SUPERNUMERARY NIPPLE Right Flank Discussed referral to general surgeon for excision. Patient would like referral placed for excision.  Related Procedures Ambulatory referral to General Surgery  Supernumerary nipple -     Ambulatory referral to General Surgery  Other orders -     Clindamycin Phosphate; Apply 1 Application topically 2 (two) times daily. Apply to the affected area of skin once daily  Dispense: 60 each; Refill: 5    Return to clinic: Return for 4-6 month TBSE, HS follow-up , w/ Dr. Raymund.  I, Jacquelynn V. Wilfred, CMA, am acting as scribe for Lauraine JAYSON Raymund, MD .   Documentation: I have reviewed the above documentation for accuracy and completeness, and I agree with the above.  Lauraine JAYSON Raymund, MD

## 2024-06-17 NOTE — Patient Instructions (Addendum)
 Sunscreen  Who needs sunscreen? Everyone. Sunscreen use can help prevent skin cancer by protecting you from the sun's harmful ultraviolet rays. Anyone can get skin cancer, regardless of age, gender or race. In fact, it is estimated that one in five Americans will develop skin cancer in their lifetime.  Sunscreen alone cannot fully protect you. In addition to wearing sunscreen, dermatologists recommend taking the following steps to protect your skin and find skin cancer early:  Seek shade when appropriate, remembering that the sun's rays are strongest between 10 a.m. and 2 p.m. If your shadow is shorter than you are, seek shade. Dress to protect yourself from the sun by wearing a lightweight long-sleeved shirt, pants, a wide-brimmed hat and sunglasses, when possible.  Use extra caution near water , snow and sand as they reflect the damaging rays of the sun, which can increase your chance of sunburn.  Get vitamin D  safely through a healthy diet that may include vitamin supplements. Don't seek the sun. Avoid tanning beds. Ultraviolet light from the sun and tanning beds can cause skin cancer and wrinkling. If you want to look tan, you may wish to use a self-tanning product, but continue to use sunscreen with it.  When should I use sunscreen? Every day you go outside--even if you're just walking to and from your form of transportation. The sun emits harmful UV rays year-round. Even on cloudy days, up to 80 percent of the sun's harmful UV rays can penetrate your skin. Snow, sand and water  increase the need for sunscreen because they reflect the sun's rays.  How much sunscreen should I use, and how often should I apply it? Most people only apply 25-50 percent of the recommended amount of sunscreen. Apply enough sunscreen to cover all exposed skin. Most adults need about 1 ounce -- or enough to fill a shot glass -- to fully cover their body.  Don't forget to apply to the tops of your feet, your neck, your ears  and the top of your head. Apply sunscreen to dry skin 15 minutes before going outdoors.  Skin cancer also can form on the lips. To protect your lips, apply a lip balm or lipstick that contains sunscreen with an SPF of 30 or higher.  When outdoors, reapply sunscreen approximately every two hours, or after swimming or sweating, according to the directions on the bottle.   Broad-spectrum sunscreens protect against both UVA and UVB rays. What is the difference between the rays? Sunlight consists of two types of harmful rays that reach the earth -- UVA rays and UVB rays. Overexposure to either can lead to skin cancer. In addition to causing skin cancer, here's what each of these rays do:  UVA rays (or aging rays) can prematurely age your skin, causing wrinkles and age spots, and can pass through window glass. UVB rays (or burning rays) are the primary cause of sunburn and are blocked by window glass  There is no safe way to tan. Every time you tan, you damage your skin. As this damage builds, you speed up the aging of your skin and increase your risk for all types of skin cancer.  What is the difference between chemical and physical sunscreens? Chemical sunscreens work like a sponge, absorbing the sun's rays. They contain one or more of the following active ingredients: oxybenzone, avobenzone, octisalate, octocrylene, homosalate and octinoxate. These formulations tend to be easier to rub into the skin without leaving a white residue.   Physical sunscreens work like a shield,  sitting sit on the surface of your skin and deflecting the sun's rays. They contain the active ingredients zinc oxide and/or titanium dioxide. Use this sunscreen if you have sensitive skin.   What type of sunscreen should I use? The best type of sunscreen is the one you will use again and again. Just make sure it offers broad-spectrum (UVA and UVB) protection, has an SPF of 30+, and is water -resistant. The kind of sunscreen you use is  a matter of personal choice, and may vary depending on the area of the body to be protected. Available sunscreen options include lotions, creams, gels, ointments, wax sticks and sprays.  Recommended physical sunscreens for face: - Neutrogena Sheer Zinc - Aveeno Positively Mineral Sensitive - CeraVe Hydrating Mineral (also has a tinted version) - La Roche-Posay Anthelios Mineral Face (comes as a cream, lotion, light fluid, and there is also a tinted version).  - EltaMD UV Clear (also has a tinted version)  Recommended physical sunscreens for body: - Neutrogena Sheer Zinc Dry-Touch Sunscreen Sensitive Skin Lotion Broad Spectrum SPF 50 - Aveeno Positively Mineral Sensitive Skin Sunscreen Broad Spectrum SPF 50 - La Roche-Posay Anthelios SPF 50 Mineral Sunscreen - Gentle Lotion - CeraVe Hydrating Mineral Sunscreen SPF 50  Recommended chemical sunscreens for face: - Anthelios UV Correct Face Sunscreen SPF 70 with Niacinamide - Neutrogena Clear Face Oil-Free SPF 50 with Helioplex - Neutrogena Sport Face Oil-Free SPF 70+ with Helioplex - Aveeno Protect + Hydrate Sunscreen For Face SPF 70 - La Roche-Posay Anthelios Light Fluid Sunscreen for Face SPF 60  Recommended chemical sunscreens for body: - Neutrogena Ultra Sheer Dry-Touch Sunscreen SPF 70 - Aveeno Protect + Hydrate Broad Spectrum All-Day Hydration SPF 60 (comes in a big pump) - La Roche-Posay Anthelios Melt-In Milk Sunscreen SPF 60    Due to recent changes in healthcare laws, you may see results of your pathology and/or laboratory studies on MyChart before the doctors have had a chance to review them. We understand that in some cases there may be results that are confusing or concerning to you. Please understand that not all results are received at the same time and often the doctors may need to interpret multiple results in order to provide you with the best plan of care or course of treatment. Therefore, we ask that you please give us  2  business days to thoroughly review all your results before contacting the office for clarification. Should we see a critical lab result, you will be contacted sooner.   If You Need Anything After Your Visit  If you have any questions or concerns for your doctor, please call our main line at [phone] and press option 4 to reach your doctor's medical assistant. If no one answers, please leave a voicemail as directed and we will return your call as soon as possible. Messages left after 4 pm will be answered the following business day.   You may also send us  a message via MyChart. We typically respond to MyChart messages within 1-2 business days.  For prescription refills, please ask your pharmacy to contact our office. Our fax number is [phone].  If you have an urgent issue when the clinic is closed that cannot wait until the next business day, you can page your doctor at the number below.    Please note that while we do our best to be available for urgent issues outside of office hours, we are not available 24/7.   If you have an urgent issue and are  unable to reach us , you may choose to seek medical care at your doctor's office, retail clinic, urgent care center, or emergency room.  If you have a medical emergency, please immediately call 911 or go to the emergency department.  Pager Numbers  - Dr. Hester: [phone]  - Dr. Jackquline: [phone]  - Dr. Claudene: [phone]   In the event of inclement weather, please call our main line at [phone] for an update on the status of any delays or closures.  Dermatology Medication Tips: Please keep the boxes that topical medications come in in order to help keep track of the instructions about where and how to use these. Pharmacies typically print the medication instructions only on the boxes and not directly on the medication tubes.   If your medication is too expensive, please contact our office at [phone] option 4 or  send us  a message through MyChart.   We are unable to tell what your co-pay for medications will be in advance as this is different depending on your insurance coverage. However, we may be able to find a substitute medication at lower cost or fill out paperwork to get insurance to cover a needed medication.   If a prior authorization is required to get your medication covered by your insurance company, please allow us  1-2 business days to complete this process.  Drug prices often vary depending on where the prescription is filled and some pharmacies may offer cheaper prices.  The website www.goodrx.com contains coupons for medications through different pharmacies. The prices here do not account for what the cost may be with help from insurance (it may be cheaper with your insurance), but the website can give you the price if you did not use any insurance.  - You can print the associated coupon and take it with your prescription to the pharmacy.  - You may also stop by our office during regular business hours and pick up a GoodRx coupon card.  - If you need your prescription sent electronically to a different pharmacy, notify our office through [facility] or by phone at [phone] option 4.     Si Usted Necesita Algo Despus de Su Visita  Tambin puede enviarnos un mensaje a travs de Clinical cytogeneticist. Por lo general respondemos a los mensajes de MyChart en el transcurso de 1 a 2 das hbiles.  Para renovar recetas, por favor pida a su farmacia que se ponga en contacto con nuestra oficina. Randi lakes de fax es [place] [phone].  Si tiene un asunto urgente cuando la clnica est cerrada y que no puede esperar hasta el siguiente da hbil, puede llamar/localizar a su doctor(a) al nmero que aparece a continuacin.   Por favor, tenga en cuenta que aunque hacemos todo lo posible para estar disponibles para asuntos urgentes fuera del horario de [place], no estamos disponibles las 24 horas del  da, los 7 [address]  [address] de la [place].   Si tiene un problema urgente y no puede comunicarse con nosotros, puede optar por buscar atencin mdica  en el consultorio de su doctor(a), en una clnica privada, en un centro de atencin urgente o en una sala de emergencias.  Si tiene Engineer, drilling, por favor llame inmediatamente al 911 o vaya a la sala de emergencias.  Nmeros de bper  - Dr. Hester: [phone]  - Dra. Jackquline: [phone]  - Dr. Claudene: [phone]   En caso de inclemencias del tiempo, por favor llame a landry capes principal al [phone] para ignacia actualizacin sobre  el estado de cualquier retraso o cierre.  Consejos para la medicacin en dermatologa: Por favor, guarde las cajas en las que vienen los medicamentos de uso tpico para ayudarle a seguir las instrucciones sobre dnde y cmo usarlos. Las farmacias generalmente imprimen las instrucciones del medicamento slo en las cajas y no directamente en los tubos del [place].   Si su medicamento es muy caro, por favor, pngase en contacto con landry rieger llamando al [phone] y presione la opcin 4 o envenos un mensaje a travs de Clinical cytogeneticist.   No podemos decirle cul ser su copago por los medicamentos por adelantado ya que esto es diferente dependiendo de la cobertura de su seguro. Sin embargo, es posible que podamos encontrar un medicamento sustituto a Audiological scientist un formulario para que el seguro cubra el medicamento que se considera necesario.   Si se requiere una autorizacin previa para que su compaa de seguros malta su medicamento, por favor permtanos de 1 a 2 das hbiles para completar este proceso.  Los precios de los medicamentos varan con frecuencia dependiendo del Environmental consultant de dnde se surte la receta y alguna farmacias pueden ofrecer precios ms baratos.  El sitio web www.goodrx.com tiene cupones para medicamentos de Health and safety inspector. Los precios aqu no tienen en cuenta lo que podra  costar con la ayuda del seguro (puede ser ms barato con su seguro), pero el sitio web puede darle el precio si no utiliz Tourist information centre manager.  - Puede imprimir el cupn correspondiente y llevarlo con su receta a la farmacia.  - Tambin puede pasar por nuestra oficina durante el horario de atencin regular y Education officer, museum una tarjeta de cupones de GoodRx.  - Si necesita que su receta se enve electrnicamente a una farmacia diferente, informe a nuestra oficina a travs de MyChart de [place] o por telfono llamando al [phone] y presione la opcin 4.

## 2024-07-02 ENCOUNTER — Ambulatory Visit: Admitting: General Surgery

## 2024-07-02 ENCOUNTER — Encounter: Payer: Self-pay | Admitting: General Surgery

## 2024-07-02 VITALS — BP 141/91 | HR 70 | Ht 68.0 in | Wt 181.0 lb

## 2024-07-02 DIAGNOSIS — Q833 Accessory nipple: Secondary | ICD-10-CM | POA: Diagnosis not present

## 2024-07-02 NOTE — Patient Instructions (Addendum)
 We will see you back once you are done breast feeding, 2 or 3 weeks after. Just give our office a call and we can schedule you an appointment to come back to see Dr Marinda  Please call the office if you have any questions or concerns

## 2024-07-07 NOTE — Progress Notes (Signed)
 Patient ID: Glenda Morrison, female   DOB: Mar 10, 1996, 28 y.o.   MRN: [id number] CC: Supernumerary Nipples History of Present Illness Glenda Morrison is a 28 y.o. female with past medical history as below who presents in consultation for supernumerary nipples.  The patient reports that she has noticed this all her life.  She says that it has not really bothered her except for sometimes it does get sore during her menstrual cycle.  She also reports that when she had her last baby she did get some milk supply at the very beginning of breast-feeding.  She denies any other discharge from the area.  She is currently breast-feeding her second baby..  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 8 g 0   beclomethasone (QVAR  REDIHALER) 40 MCG/ACT inhaler Inhale 2 puffs into the lungs 2 (two) times daily. Rinse mouth after each use. 1 each 0   clindamycin (CLEOCIN T) 1 % SWAB Apply 1 Application topically 2 (two) times daily. Apply to the affected area of skin once daily 60 each 5   ferrous sulfate  325 (65 FE) MG tablet Take 1 tablet (325 mg total) by mouth  every other day. 30 tablet 3   loratadine (CLARITIN) 10 MG tablet Take 10 mg by mouth daily as needed for allergies.     mupirocin  ointment (BACTROBAN ) 2 % Apply 1 Application topically 2 (two) times daily as needed. 30 g 0   OVER THE COUNTER MEDICATION Take 27 mg by mouth every other day.  Delightful Iron Vitamin Vegan Chewable     Prenatal Vit-Fe Fumarate-FA (PRENATAL MULTIVITAMIN) TABS tablet Take 1 tablet by mouth at bedtime.     No current facility-administered medications for this visit.    Family History Family History  Problem Relation Age of Onset   Healthy Mother    Arthritis Father    Stroke Father    Pancreatic cancer Maternal Uncle    Stroke Paternal Uncle    Arthritis Maternal Grandmother    Esophageal cancer Maternal Grandmother    Cancer Maternal Grandmother        Stomach   Pancreatic cancer Maternal Grandfather    Alcohol abuse Paternal Grandfather    Breast cancer Other        great grandmother       Social History Social History   Tobacco Use   Smoking status: Never   Smokeless tobacco: Never  Vaping Use   Vaping status: Never Used  Substance Use Topics   Alcohol use: Not Currently    Alcohol/week: 0.0 standard drinks of alcohol  Comment: Socially   Drug use: No        ROS Full ROS of systems performed and is otherwise negative there than what is stated in the HPI  Physical Exam Blood pressure (!) 141/91, pulse 70, height 5' 8 (1.727 m), weight 181 lb (82.1 kg), last menstrual period 06/20/2022, SpO2 98%, currently breastfeeding.  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  I was unable to express any discharge from either side. Data Reviewed Reviewed notes and patient is 2 months postop from  C-section  I have personally reviewed the patient's imaging and medical records.    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  I discussed with her that we can excise these but I would like to wait until she is about 4 to 6 weeks done with breast-feeding.  I discussed the risk, benefits alternatives of the procedure including risk of infection, bleeding, recurrence and poor cosmetic outcomes.  She understands these risks and she will call us  back about a month after she has finished breast-feeding to have these removed.  A total of 48 minutes was spent reviewing the patient's chart, performing history and physical and discussing treatment options with the patient Glenda Morrison

## 2024-07-29 ENCOUNTER — Telehealth: Admitting: Family Medicine

## 2024-07-29 DIAGNOSIS — B9689 Other specified bacterial agents as the cause of diseases classified elsewhere: Secondary | ICD-10-CM

## 2024-07-29 MED ORDER — AMOXICILLIN-POT CLAVULANATE 875-125 MG PO TABS: 1.0000 | ORAL_TABLET | Freq: Two times a day (BID) | ORAL | 0 refills | Status: AC

## 2024-07-29 NOTE — Progress Notes (Signed)

## 2024-08-28 ENCOUNTER — Ambulatory Visit: Admitting: Primary Care

## 2024-10-29 ENCOUNTER — Ambulatory Visit
